# Patient Record
Sex: Female | Born: 1945 | Race: White | Hispanic: No | Smoking: Former smoker
Health system: Southern US, Community
[De-identification: ages and names within clinical notes are randomized; demographics above are authoritative.]

## PROBLEM LIST (undated history)

## (undated) DIAGNOSIS — M542 Cervicalgia: Secondary | ICD-10-CM

## (undated) DIAGNOSIS — M2550 Pain in unspecified joint: Secondary | ICD-10-CM

## (undated) DIAGNOSIS — L84 Corns and callosities: Secondary | ICD-10-CM

## (undated) DIAGNOSIS — M797 Fibromyalgia: Secondary | ICD-10-CM

## (undated) DIAGNOSIS — E669 Obesity, unspecified: Secondary | ICD-10-CM

## (undated) DIAGNOSIS — IMO0002 Reserved for concepts with insufficient information to code with codable children: Secondary | ICD-10-CM

## (undated) DIAGNOSIS — G629 Polyneuropathy, unspecified: Secondary | ICD-10-CM

## (undated) DIAGNOSIS — M1990 Unspecified osteoarthritis, unspecified site: Secondary | ICD-10-CM

## (undated) DIAGNOSIS — C4490 Unspecified malignant neoplasm of skin, unspecified: Secondary | ICD-10-CM

## (undated) DIAGNOSIS — Z8719 Personal history of other diseases of the digestive system: Secondary | ICD-10-CM

## (undated) DIAGNOSIS — M549 Dorsalgia, unspecified: Secondary | ICD-10-CM

## (undated) DIAGNOSIS — G4730 Sleep apnea, unspecified: Secondary | ICD-10-CM

## (undated) HISTORY — DX: Fibromyalgia: M79.7

## (undated) HISTORY — DX: Polyneuropathy, unspecified: G62.9

## (undated) HISTORY — PX: TOTAL KNEE ARTHROPLASTY: SHX125

## (undated) HISTORY — PX: OTHER SURGICAL HISTORY: SHX169

## (undated) HISTORY — DX: Unspecified osteoarthritis, unspecified site: M19.90

## (undated) HISTORY — PX: ROTATOR CUFF REPAIR: SHX139

## (undated) HISTORY — DX: Obesity, unspecified: E66.9

## (undated) HISTORY — PX: BACK SURGERY: SHX140

## (undated) HISTORY — DX: Dorsalgia, unspecified: M54.9

## (undated) HISTORY — DX: Reserved for concepts with insufficient information to code with codable children: IMO0002

## (undated) HISTORY — PX: APPENDECTOMY: SHX54

## (undated) HISTORY — DX: Pain in unspecified joint: M25.50

## (undated) HISTORY — DX: Cervicalgia: M54.2

---

## 1992-02-24 HISTORY — PX: TOTAL ABDOMINAL HYSTERECTOMY: SHX209

## 1997-10-19 ENCOUNTER — Ambulatory Visit (HOSPITAL_COMMUNITY): Admission: RE | Admit: 1997-10-19 | Discharge: 1997-10-19 | Payer: Self-pay | Admitting: Neurosurgery

## 1998-08-13 ENCOUNTER — Other Ambulatory Visit: Admission: RE | Admit: 1998-08-13 | Discharge: 1998-08-13 | Payer: Self-pay | Admitting: Gastroenterology

## 1998-08-13 ENCOUNTER — Encounter (INDEPENDENT_AMBULATORY_CARE_PROVIDER_SITE_OTHER): Payer: Self-pay

## 1999-01-27 ENCOUNTER — Other Ambulatory Visit: Admission: RE | Admit: 1999-01-27 | Discharge: 1999-01-27 | Payer: Self-pay | Admitting: Obstetrics and Gynecology

## 1999-10-04 ENCOUNTER — Ambulatory Visit (HOSPITAL_COMMUNITY): Admission: RE | Admit: 1999-10-04 | Discharge: 1999-10-04 | Payer: Self-pay | Admitting: Neurosurgery

## 1999-10-04 ENCOUNTER — Encounter: Payer: Self-pay | Admitting: Neurosurgery

## 1999-10-31 ENCOUNTER — Encounter: Payer: Self-pay | Admitting: Neurosurgery

## 1999-11-04 ENCOUNTER — Encounter: Payer: Self-pay | Admitting: Neurosurgery

## 1999-11-04 ENCOUNTER — Inpatient Hospital Stay (HOSPITAL_COMMUNITY): Admission: RE | Admit: 1999-11-04 | Discharge: 1999-11-05 | Payer: Self-pay | Admitting: Neurosurgery

## 1999-11-06 ENCOUNTER — Emergency Department (HOSPITAL_COMMUNITY): Admission: EM | Admit: 1999-11-06 | Discharge: 1999-11-06 | Payer: Self-pay | Admitting: Emergency Medicine

## 1999-11-06 ENCOUNTER — Encounter: Payer: Self-pay | Admitting: Emergency Medicine

## 2000-01-10 ENCOUNTER — Encounter: Payer: Self-pay | Admitting: Neurosurgery

## 2000-01-10 ENCOUNTER — Ambulatory Visit (HOSPITAL_COMMUNITY): Admission: RE | Admit: 2000-01-10 | Discharge: 2000-01-10 | Payer: Self-pay | Admitting: Neurosurgery

## 2000-02-03 ENCOUNTER — Ambulatory Visit (HOSPITAL_COMMUNITY): Admission: RE | Admit: 2000-02-03 | Discharge: 2000-02-03 | Payer: Self-pay | Admitting: Neurosurgery

## 2000-02-03 ENCOUNTER — Encounter: Payer: Self-pay | Admitting: Neurosurgery

## 2000-02-18 ENCOUNTER — Ambulatory Visit (HOSPITAL_COMMUNITY): Admission: RE | Admit: 2000-02-18 | Discharge: 2000-02-18 | Payer: Self-pay | Admitting: Neurosurgery

## 2000-02-18 ENCOUNTER — Encounter: Payer: Self-pay | Admitting: Neurosurgery

## 2000-03-11 ENCOUNTER — Ambulatory Visit (HOSPITAL_COMMUNITY): Admission: RE | Admit: 2000-03-11 | Discharge: 2000-03-11 | Payer: Self-pay | Admitting: Neurosurgery

## 2000-03-11 ENCOUNTER — Encounter: Payer: Self-pay | Admitting: Neurosurgery

## 2001-09-07 ENCOUNTER — Ambulatory Visit (HOSPITAL_COMMUNITY): Admission: RE | Admit: 2001-09-07 | Discharge: 2001-09-07 | Payer: Self-pay | Admitting: Neurosurgery

## 2001-12-21 ENCOUNTER — Other Ambulatory Visit: Admission: RE | Admit: 2001-12-21 | Discharge: 2001-12-21 | Payer: Self-pay | Admitting: *Deleted

## 2002-05-09 ENCOUNTER — Encounter: Admission: RE | Admit: 2002-05-09 | Discharge: 2002-06-19 | Payer: Self-pay | Admitting: Orthopedic Surgery

## 2002-11-10 ENCOUNTER — Encounter
Admission: RE | Admit: 2002-11-10 | Discharge: 2003-02-08 | Payer: Self-pay | Admitting: Physical Medicine & Rehabilitation

## 2003-02-08 ENCOUNTER — Ambulatory Visit (HOSPITAL_COMMUNITY): Admission: RE | Admit: 2003-02-08 | Discharge: 2003-02-08 | Payer: Self-pay | Admitting: Neurosurgery

## 2003-03-20 ENCOUNTER — Encounter
Admission: RE | Admit: 2003-03-20 | Discharge: 2003-06-18 | Payer: Self-pay | Admitting: Physical Medicine & Rehabilitation

## 2003-04-03 ENCOUNTER — Encounter
Admission: RE | Admit: 2003-04-03 | Discharge: 2003-04-24 | Payer: Self-pay | Admitting: Physical Medicine & Rehabilitation

## 2003-05-09 ENCOUNTER — Ambulatory Visit (HOSPITAL_COMMUNITY)
Admission: RE | Admit: 2003-05-09 | Discharge: 2003-05-09 | Payer: Self-pay | Admitting: Physical Medicine & Rehabilitation

## 2004-01-11 ENCOUNTER — Encounter: Admission: RE | Admit: 2004-01-11 | Discharge: 2004-01-11 | Payer: Self-pay | Admitting: Neurosurgery

## 2004-04-12 ENCOUNTER — Ambulatory Visit (HOSPITAL_COMMUNITY): Admission: RE | Admit: 2004-04-12 | Discharge: 2004-04-12 | Payer: Self-pay | Admitting: Neurosurgery

## 2004-07-22 ENCOUNTER — Inpatient Hospital Stay (HOSPITAL_COMMUNITY): Admission: RE | Admit: 2004-07-22 | Discharge: 2004-07-28 | Payer: Self-pay | Admitting: Neurosurgery

## 2005-05-06 ENCOUNTER — Ambulatory Visit: Payer: Self-pay | Admitting: Cardiology

## 2005-06-24 ENCOUNTER — Encounter: Payer: Self-pay | Admitting: Neurosurgery

## 2005-07-02 ENCOUNTER — Ambulatory Visit (HOSPITAL_COMMUNITY): Admission: RE | Admit: 2005-07-02 | Discharge: 2005-07-02 | Payer: Self-pay | Admitting: Neurosurgery

## 2005-08-13 ENCOUNTER — Encounter
Admission: RE | Admit: 2005-08-13 | Discharge: 2005-09-04 | Payer: Self-pay | Admitting: Physical Medicine & Rehabilitation

## 2006-02-12 ENCOUNTER — Inpatient Hospital Stay (HOSPITAL_COMMUNITY): Admission: RE | Admit: 2006-02-12 | Discharge: 2006-02-18 | Payer: Self-pay | Admitting: Specialist

## 2006-02-15 ENCOUNTER — Ambulatory Visit: Payer: Self-pay | Admitting: Physical Medicine & Rehabilitation

## 2006-03-15 ENCOUNTER — Encounter: Admission: RE | Admit: 2006-03-15 | Discharge: 2006-04-12 | Payer: Self-pay | Admitting: Specialist

## 2006-04-13 ENCOUNTER — Encounter: Admission: RE | Admit: 2006-04-13 | Discharge: 2006-04-26 | Payer: Self-pay | Admitting: Specialist

## 2006-12-14 ENCOUNTER — Ambulatory Visit: Payer: Self-pay | Admitting: Cardiology

## 2007-01-11 ENCOUNTER — Ambulatory Visit: Payer: Self-pay | Admitting: Gastroenterology

## 2007-01-11 LAB — CONVERTED CEMR LAB
ALT: 22 units/L (ref 0–35)
AST: 22 units/L (ref 0–37)
Albumin: 3.7 g/dL (ref 3.5–5.2)
Alkaline Phosphatase: 77 units/L (ref 39–117)
BUN: 17 mg/dL (ref 6–23)
Basophils Absolute: 0 10*3/uL (ref 0.0–0.1)
Basophils Relative: 0.2 % (ref 0.0–1.0)
Bilirubin, Direct: 0.1 mg/dL (ref 0.0–0.3)
CO2: 29 meq/L (ref 19–32)
Calcium: 9.2 mg/dL (ref 8.4–10.5)
Chloride: 99 meq/L (ref 96–112)
Creatinine, Ser: 0.8 mg/dL (ref 0.4–1.2)
Eosinophils Absolute: 0.1 10*3/uL (ref 0.0–0.6)
Eosinophils Relative: 0.7 % (ref 0.0–5.0)
Ferritin: 51 ng/mL (ref 10.0–291.0)
Folate: 19.9 ng/mL
GFR calc Af Amer: 94 mL/min
GFR calc non Af Amer: 78 mL/min
Glucose, Bld: 106 mg/dL — ABNORMAL HIGH (ref 70–99)
HCT: 43.5 % (ref 36.0–46.0)
Hemoglobin: 15.5 g/dL — ABNORMAL HIGH (ref 12.0–15.0)
Iron: 106 ug/dL (ref 42–145)
Lymphocytes Relative: 34.6 % (ref 12.0–46.0)
MCHC: 35.5 g/dL (ref 30.0–36.0)
MCV: 87.8 fL (ref 78.0–100.0)
Monocytes Absolute: 0.8 10*3/uL — ABNORMAL HIGH (ref 0.2–0.7)
Monocytes Relative: 8.6 % (ref 3.0–11.0)
Neutro Abs: 4.9 10*3/uL (ref 1.4–7.7)
Neutrophils Relative %: 55.9 % (ref 43.0–77.0)
Platelets: 258 10*3/uL (ref 150–400)
Potassium: 4.1 meq/L (ref 3.5–5.1)
RBC: 4.96 M/uL (ref 3.87–5.11)
RDW: 12.9 % (ref 11.5–14.6)
Saturation Ratios: 26.9 % (ref 20.0–50.0)
Sed Rate: 7 mm/hr (ref 0–25)
Sodium: 140 meq/L (ref 135–145)
TSH: 3.59 microintl units/mL (ref 0.35–5.50)
Tissue Transglutaminase Ab, IgA: 0.3 units (ref <7)
Total Bilirubin: 0.9 mg/dL (ref 0.3–1.2)
Total Protein: 6.8 g/dL (ref 6.0–8.3)
Transferrin: 281.4 mg/dL (ref >=212.0)
Vitamin B-12: 218 pg/mL (ref 211–911)
WBC: 8.9 10*3/uL (ref 4.5–10.5)

## 2007-01-17 ENCOUNTER — Encounter: Payer: Self-pay | Admitting: Gastroenterology

## 2007-01-17 ENCOUNTER — Ambulatory Visit: Payer: Self-pay | Admitting: Gastroenterology

## 2007-01-17 DIAGNOSIS — K5730 Diverticulosis of large intestine without perforation or abscess without bleeding: Secondary | ICD-10-CM | POA: Insufficient documentation

## 2007-01-17 DIAGNOSIS — K210 Gastro-esophageal reflux disease with esophagitis: Secondary | ICD-10-CM

## 2007-01-17 DIAGNOSIS — K449 Diaphragmatic hernia without obstruction or gangrene: Secondary | ICD-10-CM | POA: Insufficient documentation

## 2007-01-27 ENCOUNTER — Ambulatory Visit: Payer: Self-pay | Admitting: Gastroenterology

## 2007-03-12 DIAGNOSIS — M549 Dorsalgia, unspecified: Secondary | ICD-10-CM | POA: Insufficient documentation

## 2007-03-12 DIAGNOSIS — M129 Arthropathy, unspecified: Secondary | ICD-10-CM | POA: Insufficient documentation

## 2007-03-12 DIAGNOSIS — K589 Irritable bowel syndrome without diarrhea: Secondary | ICD-10-CM | POA: Insufficient documentation

## 2007-03-12 DIAGNOSIS — R109 Unspecified abdominal pain: Secondary | ICD-10-CM | POA: Insufficient documentation

## 2007-03-12 DIAGNOSIS — E669 Obesity, unspecified: Secondary | ICD-10-CM

## 2007-03-12 DIAGNOSIS — K5900 Constipation, unspecified: Secondary | ICD-10-CM | POA: Insufficient documentation

## 2007-03-12 DIAGNOSIS — D126 Benign neoplasm of colon, unspecified: Secondary | ICD-10-CM | POA: Insufficient documentation

## 2007-03-12 DIAGNOSIS — K6389 Other specified diseases of intestine: Secondary | ICD-10-CM | POA: Insufficient documentation

## 2007-03-12 DIAGNOSIS — E7800 Pure hypercholesterolemia, unspecified: Secondary | ICD-10-CM

## 2007-03-31 ENCOUNTER — Ambulatory Visit: Payer: Self-pay | Admitting: Surgery

## 2007-08-17 ENCOUNTER — Encounter: Admission: RE | Admit: 2007-08-17 | Discharge: 2007-08-17 | Payer: Self-pay | Admitting: Neurosurgery

## 2007-10-12 ENCOUNTER — Encounter: Admission: RE | Admit: 2007-10-12 | Discharge: 2007-11-21 | Payer: Self-pay | Admitting: Specialist

## 2008-06-25 ENCOUNTER — Encounter: Admission: RE | Admit: 2008-06-25 | Discharge: 2008-06-25 | Payer: Self-pay | Admitting: Specialist

## 2008-12-31 ENCOUNTER — Encounter: Admission: RE | Admit: 2008-12-31 | Discharge: 2009-02-21 | Payer: Self-pay | Admitting: Orthopaedic Surgery

## 2009-07-16 ENCOUNTER — Ambulatory Visit (HOSPITAL_COMMUNITY): Admission: RE | Admit: 2009-07-16 | Discharge: 2009-07-16 | Payer: Self-pay | Admitting: Surgery

## 2009-07-31 ENCOUNTER — Ambulatory Visit (HOSPITAL_BASED_OUTPATIENT_CLINIC_OR_DEPARTMENT_OTHER): Admission: RE | Admit: 2009-07-31 | Discharge: 2009-07-31 | Payer: Self-pay | Admitting: Surgery

## 2009-08-03 ENCOUNTER — Ambulatory Visit: Payer: Self-pay | Admitting: Internal Medicine

## 2009-08-20 ENCOUNTER — Ambulatory Visit (HOSPITAL_COMMUNITY): Admission: RE | Admit: 2009-08-20 | Discharge: 2009-08-20 | Payer: Self-pay | Admitting: Surgery

## 2009-08-27 ENCOUNTER — Encounter: Admission: RE | Admit: 2009-08-27 | Discharge: 2009-10-31 | Payer: Self-pay | Admitting: Surgery

## 2009-12-03 ENCOUNTER — Inpatient Hospital Stay (HOSPITAL_COMMUNITY): Admission: RE | Admit: 2009-12-03 | Discharge: 2009-12-04 | Payer: Self-pay | Admitting: Surgery

## 2009-12-03 HISTORY — PX: LAPAROSCOPIC GASTRIC BANDING: SHX1100

## 2009-12-17 ENCOUNTER — Encounter
Admission: RE | Admit: 2009-12-17 | Discharge: 2010-03-17 | Payer: Self-pay | Source: Home / Self Care | Attending: Surgery | Admitting: Surgery

## 2010-02-03 ENCOUNTER — Encounter
Admission: RE | Admit: 2010-02-03 | Discharge: 2010-03-25 | Payer: Self-pay | Source: Home / Self Care | Attending: Surgery | Admitting: Surgery

## 2010-04-07 ENCOUNTER — Encounter: Payer: Medicare Other | Attending: Surgery | Admitting: *Deleted

## 2010-04-07 DIAGNOSIS — Z09 Encounter for follow-up examination after completed treatment for conditions other than malignant neoplasm: Secondary | ICD-10-CM | POA: Insufficient documentation

## 2010-04-07 DIAGNOSIS — Z9884 Bariatric surgery status: Secondary | ICD-10-CM | POA: Insufficient documentation

## 2010-04-07 DIAGNOSIS — Z713 Dietary counseling and surveillance: Secondary | ICD-10-CM | POA: Insufficient documentation

## 2010-05-08 LAB — COMPREHENSIVE METABOLIC PANEL
ALT: 39 U/L — ABNORMAL HIGH (ref 0–35)
AST: 31 U/L (ref 0–37)
Albumin: 4 g/dL (ref 3.5–5.2)
Alkaline Phosphatase: 71 U/L (ref 39–117)
BUN: 15 mg/dL (ref 6–23)
CO2: 29 mEq/L (ref 19–32)
Calcium: 9.2 mg/dL (ref 8.4–10.5)
Chloride: 105 mEq/L (ref 96–112)
Creatinine, Ser: 0.57 mg/dL (ref 0.4–1.2)
GFR calc Af Amer: >60 mL/min (ref >60)
GFR calc non Af Amer: >60 mL/min (ref >60)
Glucose, Bld: 103 mg/dL — ABNORMAL HIGH (ref 70–99)
Potassium: 4.5 mEq/L (ref 3.5–5.1)
Sodium: 141 mEq/L (ref 135–145)
Total Bilirubin: 0.9 mg/dL (ref 0.3–1.2)
Total Protein: 6.9 g/dL (ref 6.0–8.3)

## 2010-05-08 LAB — CBC
HCT: 45 % (ref 36.0–46.0)
Hemoglobin: 15.7 g/dL — ABNORMAL HIGH (ref 12.0–15.0)
MCH: 31.4 pg (ref 26.0–34.0)
MCHC: 34.8 g/dL (ref 30.0–36.0)
MCHC: 35 g/dL (ref 30.0–36.0)
MCV: 90.2 fL (ref 78.0–100.0)
Platelets: 209 10*3/uL (ref 150–400)
Platelets: 229 10*3/uL (ref 150–400)
RBC: 4.99 MIL/uL (ref 3.87–5.11)
RDW: 12.9 % (ref 11.5–15.5)
RDW: 13 % (ref 11.5–15.5)
WBC: 5.4 10*3/uL (ref 4.0–10.5)
WBC: 7.9 10*3/uL (ref 4.0–10.5)

## 2010-05-08 LAB — DIFFERENTIAL
Basophils Absolute: 0 10*3/uL (ref 0.0–0.1)
Basophils Absolute: 0 10*3/uL (ref 0.0–0.1)
Basophils Relative: 0 % (ref 0–1)
Basophils Relative: 0 % (ref 0–1)
Lymphocytes Relative: 22 % (ref 12–46)
Monocytes Relative: 8 % (ref 3–12)
Neutro Abs: 2.3 10*3/uL (ref 1.7–7.7)
Neutro Abs: 5.3 10*3/uL (ref 1.7–7.7)
Neutrophils Relative %: 43 % (ref 43–77)

## 2010-05-08 LAB — SURGICAL PCR SCREEN
MRSA, PCR: NEGATIVE
Staphylococcus aureus: POSITIVE — AB

## 2010-06-09 ENCOUNTER — Ambulatory Visit: Payer: Medicare Other | Admitting: *Deleted

## 2010-06-10 ENCOUNTER — Encounter: Payer: Medicare Other | Attending: Surgery | Admitting: *Deleted

## 2010-06-10 DIAGNOSIS — Z09 Encounter for follow-up examination after completed treatment for conditions other than malignant neoplasm: Secondary | ICD-10-CM | POA: Insufficient documentation

## 2010-06-10 DIAGNOSIS — Z713 Dietary counseling and surveillance: Secondary | ICD-10-CM | POA: Insufficient documentation

## 2010-06-10 DIAGNOSIS — Z9884 Bariatric surgery status: Secondary | ICD-10-CM | POA: Insufficient documentation

## 2010-07-08 NOTE — Assessment & Plan Note (Signed)
[facility]                         GASTROENTEROLOGY OFFICE NOTE   NAME:Miranda Keith, Miranda Keith                        MRN:          [id number]  DATE:01/11/2007                            DOB:          Jul 16, 1945    Miranda Keith is a 65 year old [place] female who has had several months of  constant discomfort in her left lower quadrant with associated gas,  bloating, and rather severe constipation.  She has been seen previously  in our gastroenterology clinic with a history of multiple recurrent  colon polyps and a family history of colon carcinoma.  Her last  colonoscopy exam was in August 2005.  At that time she had rather severe  melanosis coli noted.  She has responded well in the past to treatment  for constipation-predominant irritable bowel syndrome with a variety of  different medications including prokinetic therapy.  She currently is  not on any medications except for AcipHex which she takes for rather  constant recurrent acid reflux.  She denies dysphagia.   The patient follows a regular diet and denies any specific food  intolerances.  She denies fever, chills, or other systemic complaints.   PAST MEDICAL HISTORY:  Remarkable for degenerative arthritis and  hypercholesterolemia.  She has had a previous appendectomy.   MEDICATIONS:  1. Folic acid daily.  2. Allegra 180 mg a day.  3. P.r.n. hydrocodone.   She denies drug allergies.   FAMILY HISTORY:  Remarkable for two aunts and grandmother with colon  cancer.  She also relates that her father had part of his colon removed  but the diagnosis is unclear.   SOCIAL HISTORY:  She is widowed and lives by herself.  She has a 12th-  grade education.  She is retired from [employer] because of chronic back  pain.  She does not smoke or abuse ethanol.   REVIEW OF SYSTEMS:  Otherwise noncontributory.  She relates she has had  recent multiple lab tests per Dr. Sherryll Burger which have not shown any evidence  of thyroid  dysfunction or other endocrine abnormalities.  We have done  previous ultrasounds on her 10 years ago which showed an echo-dense  fatty liver but no evidence of cholelithiasis.  Liver function tests in  the past have been normal but she has had hypercholesterolemia.   She is a healthy-appearing [place] female, appearing slightly older than  her stated age.  She is in no acute distress.  She is 5 feet 5 inches  tall and weighs 264 pounds.  Blood pressure 126/76 and pulse was 86 and  regular.  I could not appreciate stigmata of chronic liver disease or  thyromegaly.  Her chest was clear.  She appeared to be in a regular  rhythm without murmurs, gallops or rubs.  Her abdomen showed exogenous  obesity but no organomegaly, masses or significant tenderness.  Bowel  sounds were normal.  Peripheral extremities were unremarkable.  Inspection of the rectum was unremarkable as was rectal exam.  There was  no evidence of a rectal mass or impaction.  Stool was present and was  guaiac negative.  ASSESSMENT:  1. Constipation-predominant irritable bowel syndrome with current      worsening of unexplained etiology.  2. Strong family history of colon carcinoma.  3. Personal history of recurrent colon polyps.  4. Chronic acid reflux without previous endoscopy.  5. Exogenous obesity - rule out fatty liver and nonalcoholic      steatohepatitis syndrome.  6. History of hypercholesterolemia.  7. Rule out endocrine dysfunction.   RECOMMENDATIONS:  1. High-fiber diet and liberal p.o. fluids.  2. Trial of MiraLax 30 mL at bedtime with Amitiza 8 mcg twice a day.  3. Reflux regime with AcipHex 20 mg 30 minutes before the first meal      of the day.  4. Followup endoscopy and colonoscopy exam.  5. Screening laboratory parameters.  6. Consider acute upper abdominal ultrasound exam.  7. Continue other medications per primary care physician.     Vania Rea. Jarold Motto, MD, Caleen Essex, FAGA  Electronically  Signed    DRP/MedQ  DD: 01/11/2007  DT: 01/12/2007  Job #: [id number]   cc:   Erasmo Leventhal, M.D.  Dr. Sherryll Burger

## 2010-07-11 NOTE — Assessment & Plan Note (Signed)
Medical record number is [id number].   Miranda Keith is back regarding her right leg pain. Still continues to be over the  right anterolateral knee region down into the lateral leg into the top of  the foot. She has occasional back pain. The pain is worse in her legs when  she sits for prolonged periods of time or stands. It also seems to correlate  to the amount of swelling that she has. We performed a triple phase bone  scan on March 16, and this revealed no signs of complex regional pain  syndrome. There were changes of osteoarthritis involving the right knee  noted. The patient has a scheduled Synvisc injection, scheduled for this  Friday with Dr. Thomasena Edis. She remains on 20 mg b.i.d. of furosemide for edema  control. She has a stress test and a cardiac echocardiogram scheduled by her  primary team. The patient still rates her pain as a 8/10.   REVIEW OF SYSTEMS:  On review of systems, the patient reports no recent  chest pain, shortness of breath, wheezing, or coughing. She has numbness in  the right leg. Swelling is noted above. Occasional blurred vision is  mentioned today. She denies any depression, problems with sleep, hearing, or  headaches. She has no nausea, vomiting, reflux, or constipation. She has  experienced some weight gain.   PHYSICAL EXAMINATION:  On physical exam, blood pressure 153/85, pulse 93.  She is saturating 96% on room air. The patient had 5/5 strength both lower  extremities. Edema was 1+ to 2+ in both legs of pitting variety from the  ankles up through the knees. Knee was palpation with palpation, especially  medially. Sensation was possibly diminished in the anterolateral knee and  lower leg. Straight leg testing was equivocal to negative on both sides.  Patrick's test was negative. Reflexes were 2+ at both knees and ankles. The  patient walks with a slight antalgic gait towards the right side.   ASSESSMENT:  Ongoing right lower extremity pain. Triple phase bone  scanning  was normal. The patient has knee injections scheduled with Dr. Thomasena Edis this  week. She has a herniated disk at L4-L5 with some facet hypertrophy and  lateral stenosis, more so on the left than the right. This could possibly  account for some of the pain symptoms in her leg. I do not believe that the  leg pain is secondary to her knee itself or a neuropathy surrounding her  knee surgeries.   PLAN:  1. We will set the patient up for ___________ L4-L5 epidural steroid     injection as both a diagnostic and therapeutic effort.  2. She will continue with her Darvocet for breakthrough pain, N 100, q.4-6h.     p.r.n.  3. I sent her to [facility] today for fitting of custom compression stockings     in the 30 to 40 mL range.  4. I will see her back after completion of her epidural steroid injection.      Ranelle Oyster, M.D.   ZTS/MedQ  D:  06/06/2003 10:52:57  T:  06/06/2003 12:51:09  Job #:  [id number]

## 2010-07-11 NOTE — H&P (Signed)
Miranda Keith, Miranda Keith                 ACCOUNT NO.:  [id number]   MEDICAL RECORD NO.:  [id number]          PATIENT TYPE:  INP   LOCATION:  2899                         FACILITY:  [facility]   PHYSICIAN:  Payton Doughty, M.D.      DATE OF BIRTH:  [birth year]   DATE OF ADMISSION:  07/22/2004  DATE OF DISCHARGE:                                HISTORY & PHYSICAL   ADMITTING DIAGNOSIS:  Lumbar spondylosis and spinal epidural lipomatosis.   BODY OF TEXT:  [name] is now a 65 year old, right-handed [place] lady I have  been seeing since 1994.  She has had pain in her back for numerous years.  Epidural steroids provided about a week of relief.  She has degenerative  spondylosis and spondylolisthesis at L4-5, degenerative disc disease at L5-  S1.  She also has epidural lipomatosis at L2-3 and L3-4.  She has  significant pain in her back as well as down her right leg.  Epidural  steroids have provided minimal relief.  She presents now for evacuation of  epidermal lipomatosis at L2-3 and L3-4 and spinal fusion at L4-5 and L5-S1.   MEDICAL HISTORY:  1. Silicone breast implants.  2. Capsulotomies in 1991 and 1993.  3. Hypothyroidism.  4. She has had anterior cervical at C5-6 and C6-7 in 2001.     MEDICATIONS:  1. Vitamins.  2. ALEVE.  3. Allegra.  4. Estradiol.  5. Flaxseed oil.     SURGICAL HISTORY:  Breast implants as noted before.   SOCIAL HISTORY:  She does not smoke and does not drink.  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.   REVIEW OF SYSTEMS:  Noncontributory for bowel or bladder dysfunction.   PHYSICAL EXAMINATION:  HEENT:  Within normal limits.  NECK:  She has good range of motion in her neck and a well-healed incision.  CHEST:  Clear.  CARDIAC:  Regular rate and rhythm.  ABDOMEN:  Nontender.  No hepatosplenomegaly.  EXTREMITIES:  Without clubbing or cyanosis.  GU:  Deferred.  PULSES:  Peripheral pulses are good.  NEUROLOGIC:  She is awake, alert, and oriented.   Cranial nerves are intact.  Motor exam shows 5/5 strength throughout the upper and lower extremities  until she gets up and walks for a while.  Then she has significant pain in  her back and especially down her right leg.  Reflexes are 2 at the left  knee, 1 at the right, 1 at the ankles bilaterally.  Straight leg raise is  positive on the right.   MRI results have been reviewed above.   CLINICAL IMPRESSION:  1. Lumbar spondylosis with intractable back and right lower extremity      radiculopathy.  2. Epidural lipomatosis.     PLAN:  As outlined above, evacuation of lipomatosis at L2-3 and L3-4, and  lumbar fusion at L4-5 and L5-S1.  The risks and benefits of this approach  have been discussed with her, and she wished to proceed.      MWR/MEDQ  D:  07/22/2004  T:  07/22/2004  Job:  [id number] 

## 2010-07-11 NOTE — H&P (Signed)
[place]  Patient:    Miranda Keith, Miranda Keith                        MRN: [id number] Adm. Date:  [id number] Attending:  Emeterio Reeve                         History and Physical  ADMITTING DIAGNOSIS:  Cervical spondylosis at C5-6 and C6-7.  HISTORY OF PRESENT ILLNESS:  This is a 65 year old right-handed [place] lady whom I had been seeing since 1994 with cervical spondylosis.  She has had progression of neck, upper extremity discomfort, and has spondylosis at C5-6 and C6-7 and is admitted for an anterior cervical diskectomy and fusion.  PAST MEDICAL HISTORY:  Remarkable for having had silicone breast implants and capsulotomies for those in 1991 and 1993.  She also had hypothyroidism, for which she was on Armour thyroid.  She no longer is.  MEDICATIONS:  Vitamins, Aleve as needed, Allegra one a day, estradiol one a day, and flax seed oil.  PAST SURGICAL HISTORY:  Remarkable for the breast implants as noted before.  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.  REVIEW OF SYSTEMS:  Noncontributory for bowel and bladder dysfunction.  PHYSICAL EXAMINATION:  HEENT:  Within normal limits.  NECK:  She has reasonable range of motion in her neck with discomfort in all phases.  CHEST:  Clear.  CARDIAC:  Regular rate and rhythm.  ABDOMEN:  Nontender with no hepatosplenomegaly.  EXTREMITIES:  Without clubbing or cyanosis.  Peripheral pulses are good.  GENITOURINARY:  Deferred.  NEUROLOGIC:  She is awake, alert and oriented.  Cranial nerves are intact. The motor exam shows 5/5 strength throughout the upper and lower extremities. There is no current sensory deficit.  Reflexes are absent at the biceps or at the triceps, absent at the brachioradialis, 1 in the lower extremities at the knees, 1 at the ankles.  Toes downgoing bilaterally, and Hoffmans is negative.  DIAGNOSTIC EVALUATION:   Her MRI shows spondylitic disease, much worse at 5-6 and 6-7, with cord compression.  At 4-5, there is some foraminal narrowing.  PLAN:  Anterior cervical diskectomy and fusion at C5-6 and C6-7.  The risks and benefits of this approach have been discussed with her, and she wishes to proceed. DD:  11/04/99 TD:  11/05/99 Job: [id number]

## 2010-07-11 NOTE — Assessment & Plan Note (Signed)
HISTORY:  Miranda Keith is back regarding her right lower extremity pain.  Apparently her knee and lumbar MRIs were negative although I do not have  reports to confirm this.  She still had some right leg pain.  She has come  off of her Neurontin as she could not tolerate it.  She did not use the  Lidoderm patches regularly.  She had taken Celexa for a brief period of time  and felt that this really helped her discomfort.  She had restarted the  Topamax for a brief period of time and tolerated it from a side effect  standpoint but then did not feel that it was really helping her at this  point.   The pain remains in the right knee from essentially the kneecap down to the  foot.  Pain is most prominent along the anterolateral portion of the leg and  the lateral foot and underfoot.  She denies any cold sensations or frank  color changes in the leg.  No allodynia is noted.  She does report ongoing  numbness associated with the pain.  She denies back pain today.  Pain is  worse when she is standing for prolonged periods.  It seems to be painful at  night or day.  It is a little bit better when she extends the leg rather  than bends the leg.   REVIEW OF SYSTEMS:  Patient denies any chest pain.  She had some shortness  of breath with the Mobic and thus stopped.  Denies any recent coughing or  fevers.  She had some swelling in the legs.  No reflux symptoms, nausea,  vomiting, or diarrhea are noted.  Reports pain from fibromyalgia.   PHYSICAL EXAMINATION:  Today patient is pleasant in no acute distress.  Blood pressure 128/75, pulse 85, she is saturating 95% on room air.  Patient  has minimal pain with manipulation of the knee today and minimal  peripatellar swelling.  There is continued sensory findings of anterolateral  leg.  Reflexes are 1+ bilaterally and motor function is normal.  There is no  color or temperature changes in either leg.  No allodynia is noted.  Low  back exam is stable.   ASSESSMENT:  1. Ongoing right leg pain which appears to be neuropathic.  I am beginning     to doubt that this is a saphenous nerve inflammation.  Pain is more in     the distribution of the sciatic or peroneal nerve.  2. Osteoarthritis of the right knee.   PLAN:  1. I would like to set the patient up for nerve conduction studies of the     right lower extremity to rule out peroneal or sciatic nerve injury.  We     also will try to look at the saphenous nerve.  2. We will restart Topamax as the patient was able to tolerate it but we     need to get it to a higher dose.  We will work up to 100 mg once daily.  3. We can restart her Celexa as this seemed to help pain as well.  We can     add 20 mg at bedtime.  We will observe her response.  4. May consider other treatments such as desensitization therapies Anodyne     as appropriate.  5. We will see the patient back in approximately 2-4 weeks' time pending the     results of her nerve conduction study.  Ranelle Oyster, M.D.   ZTS/MedQ  D:  03/21/2003 11:19:45  T:  03/21/2003 13:03:42  Job #:  [id number]   cc:   Erasmo Leventhal, M.D.  [facility]  [address]  [address]  [place]  [place] [address]  Fax: [phone]

## 2010-07-11 NOTE — Assessment & Plan Note (Signed)
Miranda Keith is back regarding her right leg pain.  We sent her for outpatient  therapy at the last visit with some suspicion of complex regional pain  syndrome in the right lower extremity.  She has felt that therapy has been  beneficial while she is receiving it, particularly massage and some  desensitization exercises.  However, when she leaves therapy she feels that  the pain returns.  She is having a lot of pain in the right lower leg and  into the top of the right foot along the first and second toes.  She was  unable to tolerate the Topamax further and this was stopped.  The Topamax  did not seem to be helping her pain.  She has had problems with ongoing  edema in both lower extremities.  She is supposed to follow up with her  family doctor this week.  She remains on diuretics to help control this, as  well as off the shelf compression stockings.  The pain in her legs  definitely is associated with the amount of swelling she has in her feet and  legs.  Her right foot pain seems to be much worse when she walks for  prolonged periods of time.  She also notes pain when she sits or bends.  On  further questioning today, she stated that the pain really came from her low  back into her leg and foot today.  The nerve conduction studies that we  performed in late January revealed relayed latencies and decreased  amplitude, right more so than left, although both sides were slightly  abnormal.  I felt that most of the abnormalities were secondary to her  edema, however.  Her EMG studies were completely normal in the left lower  extremity.   The patient rates her pain as an 8/10 today.  It improves with rest and ice  and is worse with walking and bending.   I was able to review her lumbar spine scan from December, which revealed a  small left paracentral HNP at L1 and L2, grade 1 closed arch and __________  at L4 and L5, and degenerative facet changes, particularly at L4-5.  There  is a foraminal  disk herniation on the left at L4-5 with foraminal stenosis  on the right at L4-5.  There is foraminal stenosis bilaterally at L5-S1 plus  a soft disk herniation posterolateral and lateral on the left at L5-S1.   REVIEW OF SYSTEMS:  The patient notes some chest discomfort and shortness of  breath.  Swelling is noted.  She has had no wheezing or coughing.  There is  numbness in the right leg and hands as well.  (She was diagnosed with carpal  tunnel syndrome in the past.)  She has had some occasional headaches with  anxiety.  Sleep has been fair.  She denies nausea, vomiting, reflux,  diarrhea, or constipation recently.   PHYSICAL EXAMINATION:  On physical examination today, the blood pressure is  133/82, pulse 102, respiratory rate 16, and she is saturating 94% on room  air.  The patient walks with a slight limp to the right side.  She is alert  and appropriate.  Both lower extremities revealed 2+ nonpitting edema.  Strength was intact in both legs at 5/5 and reflexes were 1+ on either  extremity.  There were some slight color changes in the right foot, but  these are not significant.  The skin was warm and she had good capillary  refill.  Pulses were intact.  Straight leg testing was possibly positive on  the right today at the extreme degrees of flexions at 70 degrees plus.  Luisa Hart tests were negative on either side.  The patient had no frank  allodynia in the leg today, although she was a bit hypersensitive to more  deep stimulation today.   ASSESSMENT:  1. Ongoing right leg pain.  There is still a suspicion of complex regional     pain syndrome, type 1.  2. 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  Cannot rule out     right lumbar radiculopathy considering the multiple abnormalities on her     scan.   PLAN:  1. I would like the patient to continue with her outpatient therapies as she     has had some relief with this.  I also would like to transition her to a      lumbar back treatment plan with McKenzie exercises included.  2. We could consider a lumbar epidural injection based on her progress and     presentation next time.  3. I would like to send the patient for triple phase bone scan to rule out     complex regional pain syndrome in the right lower extremity.  4. The patient needs to work on her lower extremity edema and we discussed     some ways to do so.  She will follow up with her primary physician     regarding her diuretics.  She may need some custom TED hose.  5. I hesitate to start anything new for neuropathic pain at this point as     she will probably have some swelling side     effects with anything we try.  6. I will see her back in approximately one month's time.      Ranelle Oyster, M.D.   ZTS/MedQ  D:  05/07/2003 11:28:41  T:  05/07/2003 12:29:40  Job #:  [id number]   cc:   Erasmo Leventhal, M.D.  [facility]  [address]  [address]  [place]  [place] [address]  Fax: [phone]

## 2010-07-11 NOTE — Procedures (Signed)
NAME:  Miranda Keith, Miranda Keith                           ACCOUNT NO.:  [id number]   MEDICAL RECORD NO.:  [id number]                   PATIENT TYPE:  REC   LOCATION:  TPC                                  FACILITY:  [facility]   PHYSICIAN:  Erick Colace, M.D.           DATE OF BIRTH:  1945/06/22   DATE OF PROCEDURE:  DATE OF DISCHARGE:                                 OPERATIVE REPORT   Referred for L4-5 epidural sternoid injection.  History of right anterior  knee pain into the lateral leg and the top of the foot.   The patient denies any use of blood thinners, including specifically  Coumadin, warfarin, Aggrenox, Plavix, and aspirin.  She denies any allergy  to lidocaine, Omnipaque/contrast dye, or Kenalog.   Informed consent was obtained after explaining the risks and benefits of the  procedure to the patient.  These included bleeding, bruising, infection,  loss of bowel and bladder function, and increased pain.  She gives written  consent and elects to proceed.  The patient was placed prone on the  fluoroscopy table.  Sterile Betadine prep and sterile drape.  Fluoroscopic  guidance utilized for the procedure.  Lidocaine 1% 2 mL were used to  anesthetize the skin and subcutaneous tissues using a 25 gauge 1-1/4 inch  needle.  Then a 22 gauge spinal needle was utilized and guided under  fluoroscopic guidance into the proper position.  Then a solution of  Omnipaque 180 x 0.5 mL was injected under live fluoroscopy.  Initially the  nerve root only was enhanced, but after advancing the needle not to exceed  the 6 o'clock position medially, then subpedicular flow of the contrast was  seen after an additional 0.5 mL was injected under live fluoroscopy.  Then a  solution containing 1 mL of 40 mg/mL of Kenalog and 2 mL of methylparaben-  free lidocaine were injected.  The patient tolerated the procedure well.  Post injection instructions given.  The patient is to follow up in three  weeks.  If the  above was helpful, consider reinjection if it seems to be  wearing off.  If it was not helpful at all, will do a nonselective L4-5.                                                Erick Colace, M.D.    AEK/MEDQ  D:  06/07/2003 14:10:19  T:  06/07/2003 16:08:07  Job:  [id number]

## 2010-07-11 NOTE — H&P (Signed)
[place]. [facility]  Patient:    Miranda Keith, Miranda Keith                        MRN: [id number] Adm. Date:  [id number] Attending:  Emeterio Reeve                         History and Physical  ADMISSION DIAGNOSES:  Cervical spondylosis at C5-6 and C6-7.  HISTORY:  This is a 65 year old, right-handed [place] lady whom I have been seeing since 1994, with cervical spondylosis.  She has had progression of neck and upper extremity discomfort, and has spondylosis at C5-6 and C6-7, and is admitted for an anterior cervical diskectomy and fusion.  PAST MEDICAL HISTORY:  Remarkable for having had silicone breast implants, and capsulotomies for those in 1991, and 1993.  She also had hypothyroidism, for which she was on Armour thyroid.  She no longer is.  CURRENT MEDICATIONS: 1. Vitamins. 2. Aleve p.r.n. 3. Allegra one q.d. 4. Estradiol one q.d. 5. Flax seed oil.  PAST SURGICAL HISTORY:  Breast implants as noted before.  SOCIAL HISTORY:  She does not smoke and does not drink.  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.  REVIEW OF SYSTEMS:  Noncontributory for bowel or bladder dysfunction.  PHYSICAL EXAMINATION:  HEENT:  Within normal limits.  NECK:  She has a reasonable range of motion with discomfort in all phases.  CHEST:  Clear.  CARDIAC:  A regular rate and rhythm.  ABDOMEN:  Nontender with no hepatosplenomegaly.  EXTREMITIES:  Without clubbing or cyanosis.  Peripheral pulses good.  GENITOURINARY:  Deferred.  NEUROLOGIC:  She is awake, alert, oriented.  Her cranial nerves are intact. Motor examination shows 5/5 strength throughout the upper and lower extremities.  There is no current sensory deficit.  Reflexes are absent at the biceps, 1 at the triceps, absent at the brachial radialis, 1 in the lower extremities at the knees, 1 at the ankles.  Toes are downgoing bilaterally, and Hoffmans negative.  Her MRI shows spondylitic disease, much  worse at C5-6 and C6-7, with cord compression.  At C4-5 there is some foraminal narrowing.  PLAN:  An anterior cervical diskectomy and fusion at C5-6 and C6-7.  The risks and benefits of this approach have been discussed with her, and she wishes to proceed. DD:  11/04/99 TD:  11/04/99 Job: [id number]

## 2010-07-11 NOTE — H&P (Signed)
NAME:  Miranda Keith, Miranda Keith                 ACCOUNT NO.:  [id number]   MEDICAL RECORD NO.:  [id number]           PATIENT TYPE:   LOCATION:                                 FACILITY:   PHYSICIAN:  Erasmo Leventhal, M.D.DATE OF BIRTH:  October 04, 1945   DATE OF ADMISSION:  02/12/2006  DATE OF DISCHARGE:                              HISTORY & PHYSICAL   ADMITTING DIAGNOSIS:  End-stage osteoarthritis right knee.   BRIEF HISTORY:  This is a 65 year old lady with a history of end-stage  osteoarthritis of her right knee that has failed conservative measures.  Due to continued pain with daily activity, she is now scheduled for a  total knee arthroplasty of her right knee.  The surgery, risks,  benefits, and aftercare were discussed in detail with the patient;  questions invited and answered; and surgery to go ahead as scheduled.  Note, that on her visit to the office on Monday; she had a laceration on  the volar aspect of her small toe on the left where she hit it into a  doorway.  There were no signs of infection; and she was placed on  prophylactic Keflex and saline-soaks and we will check his right prior  to surgery; and, as well as, this looks good we will proceed with  surgery.   ALLERGIES:  Drug allergies none.   CURRENT MEDICATIONS:  1. Elavil 10 mg q.h.s.  2. __________ 20 mg p.o. p.r.n. swelling.  3. Aciphex 1 p.o. daily   PAST SURGERIES INCLUDE:  1. Lumbar fusion.  2. Cervical fusion.  3. Hysterectomy.  4. Tonsillectomy.  5. Appendectomy.  6. Knee arthroscopy.   SERIOUS MEDICAL ILLNESSES INCLUDE:  Fibromyalgia and reflux.   FAMILY HISTORY:  Positive for rheumatoid arthritis, coronary disease,  Alzheimer's, and cancer.   SOCIAL HISTORY:  The patient is single.  She lives alone at home.  She  does not smoke and does not drink.   REVIEW OF SYSTEMS:  CENTRAL NERVOUS SYSTEM:  Negative for headache,  blurred vision, or dizziness.  PULMONARY:  No shortness of breath, PND  or  orthopnea.  CARDIOVASCULAR:  Negative for chest pain or palpitation.  GI: Positive for GERD.  Negative for ulcers or hepatitis.  GU: Negative  for urinary tract difficulty.  MUSCULOSKELETAL:  Positive as in HPI.   PHYSICAL EXAM:  VITAL SIGNS:  BP 120/90, respirations 18, pulse 80 and  regular.  GENERAL APPEARANCE:  This is a well-developed, well-nourished, obese  lady in no acute distress.  HEENT:  Head normocephalic.  Nose patent.  Ears patent.  Pupils equal,  round, and react to light.  Throat without injection.  NECK:  Supple without adenopathy.  Carotids 2+ without bruit.  CHEST:  Clear to auscultation.  No rales or rhonchi.  Respirations 18.  HEART:  Regular rate and rhythm at 80 beats per without murmur.  ABDOMEN:  Soft with active bowel sounds.  No masses or organomegaly.  NEUROLOGIC:  The patient alert and oriented to time, place, and person.  Cranial nerves II-XII grossly intact.  EXTREMITIES:  The right knee shows 0-130  degree range of motion.  Dorsalis pedis and posterior tibialis pulses are 2+.  Sensation and  circulation are intact.  Her left small toe has a laceration on the  volar aspect at the MP joint.  This is already in early stages of  healing.  There is no overt infection.  X-ray shows no fracture.  We  will start her on prophylactic Keflex, and salt water soaks, and check  it just prior to surgery.  X-rays show end-stage osteoarthritis of the  right knee.   IMPRESSION:  End-stage osteoarthritis right knee.   PLAN:  Total knee arthroplasty right knee.      Jaquelyn Bitter. Chabon, P.A.    ______________________________  Erasmo Leventhal, M.D.    SJC/MEDQ  D:  02/08/2006  T:  02/08/2006  Job:  [id number]

## 2010-07-11 NOTE — Assessment & Plan Note (Signed)
REASON FOR EVALUATION:  I am seeing Aleena again about her right lower  extremity pain which we felt was probably neuropathic related to saphenous  nerve inflammation.  Dylin had difficulty tolerating the Topamax, as it was  making her dizzy and forgetful.  She nearly fell in the bathroom.  She has  felt that the medicine really was not of any benefit to her either.  She has  been seen in the interim by Dr. Payton Doughty again, who ordered lumbar spine  MRIs apparently.  Dr. Erasmo Leventhal has ordered a followup MRI of  the knee as well.  Pain seems to be worse in the right leg with mobility.  It does bother her to a degree when she sits, but it is more painful when  she is up and about.  She has had some swelling in both legs, not  preferentially in right leg.  She noticed no frank color changes.  She does  feel that the leg is cooler at times.  She notes that the pain and numbness  are in a distribution now more in the lateral leg down to the knee and into  the top of the foot.  She denies any back pain.  Her back will tighten up a  little bit with prolonged standing.   MEDICATIONS:  She remains on:  1. Mobic 7.5 mg daily.  2. Hydrocodone 10 mg for breakthrough pain.   REVIEW OF SYSTEMS:  The patient denies any nausea, vomiting or diarrhea.  Pertinent positives are listed above.  No chest pain or shortness of breath  are noted.   PHYSICAL EXAMINATION:  GENERAL:  On physical examination today, the patient  is pleasant, in no acute distress.  VITAL SIGNS:  Blood pressure is 134/74, pulse is 88, she is saturating 95%  on room air.  NEUROMUSCULAR:  On examination of the right knee, she had a little pain with  manipulation or valgus or varus stresses today.  She appeared to be stable  in the AP plane.  There was peripatellar swelling noted around both knees.  She had decreased pinprick throughout the anterolateral thigh and leg.  She  had better sensation on the back of the foot,  calf and medial posterior  thigh.  Above the waist, she had no problems.  Left leg seemed to have  normal sensation.  Motor strength was 5/5 throughout.  Reflexes were 1+  bilaterally.  I noticed no asymmetries in size between the legs.  There are  no color changes nor temperature changes between either side.  The low back  was examined and there was minimal-to-no-pain with palpation in the  lumbosacral spine, associated paraspinals or facets.  PSIS areas were  nontender.  Greater trochanter regions were minimally tender and piriformis  muscles were nontender.  She had normal range of motion with flexion,  extension, lateral rotation and lateral bending.  Straight leg maneuvers  were negative bilaterally.  Patrick's test was negative.   ASSESSMENT:  1. Ongoing right leg pain which is possibly neuropathic in relation to a     saphenous nerve inflammation.  2. Osteoarthritis of the right knee.  3. Rule out lumbar radiculopathy.  4. Consider reflex sympathetic dystrophy.   PLAN:  1. I agree with lumbar spine MRI.  However, none of her exam except for her     pain distribution and numbness fits with radiculopathy today.  2. I will await the results of her right knee MRI as well.  3. Secondary to her intolerance of the Topamax, we will begin her on a trial     of Neurontin beginning at 300 mg nightly for one week and then titrating     up by 300 mg for two weeks until we reach 300 mg t.i.d.  She is to call     me with any side-effects.  4. I gave her samples of Lidoderm patches to place along the knee and foot     as she needs.  5. If she continues to have pain, despite normal diagnostic workup, could     consider a triple phase bone scan of the     right lower extremity.  6. I will see the patient back in approximately four weeks' time.      Ranelle Oyster, M.D.   ZTS/MedQ  D:  02/02/2003 16:27:50  T:  02/03/2003 04:53:49  Job #:  [id number]   cc:   Erasmo Leventhal, M.D.   [address]  [place]  [place] [address]  Fax: [phone]   Payton Doughty, M.D.  [address]  [place]  [place] [address]  Fax: [phone]

## 2010-07-11 NOTE — Op Note (Signed)
[place]. [facility]  Patient:    Miranda Keith, Miranda Keith                        MRN: [id number] Proc. Date: 11/04/99 Adm. Date:  [id number] Attending:  Emeterio Reeve                           Operative Report  PREOPERATIVE DIAGNOSIS:  Spondylosis, disks C5-6 and C6-7.  POSTOPERATIVE DIAGNOSIS:  Spondylosis, disks C5-6 and C6-7.  OPERATION: Anterior cervical diskectomy and fusion at C5-6 and C6-7. SURGEON:   Payton Doughty, M.D.  SERVICE:  Neurosurgery.  ANESTHESIA:  General endotracheal anesthesia.  ASSISTANT:  Stefani Dama, M.D.  PREP:    In sterile manner and scrubbed with alcohol wipe.  COMPLICATIONS:  None.  INDICATIONS:  This is a 65 year old right-handed [place] lady with severe cervical spondylosis.  DESCRIPTION OF PROCEDURE:  She was taken to the operating suite, intubated, and placed prone on the operating table.  Following shave, prep, and drape in the usual sterile fashion, skin was incised paralleling sternocleidomastoid muscle, starting at the level of the broad tubercle and extending inferiorly. The platysma was identified, elevated, divided, and undermined.   The sternocleidomastoid was identified and medial dissection revealed the carotid artery retracted laterally to the left.  The trachea and the esophagus were retracted laterally to the right.  The right omohyoid muscle was divided. Intraoperative x-rays showed a marker at 4-5.  Diskectomy was carried out at the two levels below that.  Longus coli was taken down, and the shadow line retractor placed.  Diskectomy was then carried out at 5-6 and 6-7, first under gross observation and then using the operating microscope.  On both levels, there was severe spondylitic disease and impressive osteopenia, and the bone was quite soft.  Resection of the osteophytes and decompressive foraminotomy was accomplished without difficulty.   Then 7 mm bone grafts were fashioned from patellar autograft  and tapped into place.  The 41 mm tether plate was then placed with two 13 mm screws in C7, one in C6, and two in C5. Intraoperative x-rays showed good placement of the bone grafts, plate, and screws.  The wound was irrigated and hemostasis assured.  The platysma was reapproximated with 3-0 Vicryl in interrupted fashion, subcutaneous tissue was reapproximated with 3-0 Vicryl in interrupted fashion.  The skin was closed with 4-0 Vicryl in running subcuticular fashion.  Benzoin and Steri-Strips were placed including Telfa at the op site.  The patient was placed in an Aspen collar and returned to the recovery room in good condition.DD:  11/04/99 TD:  11/05/99 Job: [id number]

## 2010-07-11 NOTE — Discharge Summary (Signed)
NAMEROBYNN, Miranda                 ACCOUNT NO.:  [id number]   MEDICAL RECORD NO.:  [id number]          PATIENT TYPE:  INP   LOCATION:  3013                         FACILITY:  [facility]   PHYSICIAN:  Payton Doughty, M.D.      DATE OF BIRTH:  August 06, 1945   DATE OF ADMISSION:  07/22/2004  DATE OF DISCHARGE:  07/28/2004                                 DISCHARGE SUMMARY   ADMISSION DIAGNOSIS:  1.  Spondylosis L4-5, L5-S1.  2.  Epidural lipomatosis L2 through S1.   PROCEDURE:  1.  L2-3, L3-4 laminotomy and foraminotomy for epidural lipomatosis.  2.  L4-5, L5-S1 laminectomy and diskectomy.  3.  Posterior lumbar interbody fusion __________ fusion cages and      posterolateral arthrodesis.   ANESTHESIA:  Endotracheal __________.   COMPLICATIONS:  None.  Discharge in satisfactory condition, alive and well.   HISTORY OF PRESENT ILLNESS:  This is a 65 year old right-handed Miranda Keith.  History and physical is routine on the chart.  She has had pain in her back  with extensive degenerative change.  Epidural steroids have not provided  relief and she is admitted for decompression, fusion and evacuation of  epidural lipomatosis.   PAST MEDICAL HISTORY:  Breasts implants.  Capsulotomies.  Hypothyroidism.  Anterior and cervical decompression and fusion.   PHYSICAL EXAMINATION:  GENERAL:  Intact.  NEUROLOGIC:  Showed intact strength with intractable back and leg pain with  activity.   HOSPITAL COURSE:  She was admitted after ascertaining normal laboratory  values and underwent a lumbar fusion as noted above.  Postoperatively she  has done well.  She participated in physical therapy.  The Foley was removed  the second postoperative day.  She is eating and voiding normally.  She did  have one UTI and was treated with ciprofloxacin.  Ciprofloxacin was left on  prophylactically for several days.  Currently she is awake, alert and  oriented.  She does have some incisional pain.  She is able to get up  an  about.   DISCHARGE INSTRUCTIONS:  She is being discharged home in the care of her  family.  Percocet for pain.  Flexeril for spasm.  Her followup will be in  the [facility] office in about one week for suture  removal.     MWR/MEDQ  D:  07/28/2004  T:  07/28/2004  Job:  [id number]

## 2010-07-11 NOTE — Discharge Summary (Signed)
NAME:  Miranda Keith, Miranda Keith                 ACCOUNT NO.:  [id number]   MEDICAL RECORD NO.:  [id number]          PATIENT TYPE:  INP   LOCATION:  1612                         FACILITY:  [facility]   PHYSICIAN:  Erasmo Leventhal, M.D.DATE OF BIRTH:  1945/09/05   DATE OF ADMISSION:  02/12/2006  DATE OF DISCHARGE:  02/18/2006                               DISCHARGE SUMMARY   ADMITTING DIAGNOSIS:  End-stage osteoarthritis right knee.   DISCHARGE DIAGNOSIS:  Same.   OPERATION:  Total knee arthroplasty right knee.   BRIEF HISTORY:  This is a 65 year old lady with a history of end-stage  osteoarthritis of the right knee that has failed conservative measures  to alleviate her pain. After discussion of treatment, benefits, risks  and options now scheduled for total knee arthroplasty of her right knee.   LABORATORY VALUES:  Admission CBC showed the hemoglobin high at 16.2,  hematocrit high at 47.1, RBC high at 5.27. Hemoglobin/hematocrit reached  a low of 9.8 and 27.9 on the 24th. The admission PT/PTT within normal  limits. Admission CMET showed the glucose high at 100; otherwise, within  normal limits. She ran mildly elevated glucose to a high of 126 during  admission. Urinalysis showed trace of leukocyte esterase; otherwise,  negative.   COURSE IN THE HOSPITAL:  The patient tolerated the operative procedure  well. She did have some mild nausea the first day.  Her vital signs were  stable. She was afebrile. Hemoglobin/hematocrit were stable. Hemovac was  removed without difficulty. She was started on physical therapy and CPM.  Second postoperative day she stated she was having some moderate pain  and that she could not move her leg. Her vital signs were stable.  Temperature to a max of 100.1, now 98.3, O2 91 on 2 liters. I&O was  good.  Hemoglobin/hematocrit were stable. BMET within normal limits with  exception of glucose 114, calcium 8.1. Lungs were clear. Heart sounds  normal. Bowel sounds  sluggish. Calves were negative. She had mild pain  in her medial thigh. Negative cords. No bruising. Neurovascular status  was intact in the legs. She could contract her quadriceps and hamstrings  on command and actually could move her legs well with some prompting.  Her dressing was changed. Wound was benign. Her PCA was DC'd. Her IV was  switched to hep well, and she was placed on incentive spirometry q. 30  minutes for mild temperature. There was question about whether the  patient would make good progress at this point, and a rehab consult was  obtained. Third postoperative day she was complaining of some mild  nausea and mild headache. Her vital signs were stable. Incision was  benign. Calves were negative. She was continued on her Lovenox and daily  physical therapy. Rehab did not feel that the patient was a candidate  for rehab, and she did start to turn the corner and do better in her  physical therapy and after discussion of treatment options and skilled  nursing facility, the patient decided that she could make it at home  with home health and home physical therapy.  On February 17, 2006 she was  feeling better. Vital signs were stable. She was continuing to make slow  but steady progress in physical therapy. Her wound was benign. Calves  were negative. Her toe wound was present prior to surgery. Was healed  without difficulty, and she continued in physical therapy. On February 18, 2006 the patient was feeling better. Vital signs were stable. Her  temperatures to a max of 99, O2 94 on room air. I&O was good. Lungs  clear. Heart sounds normal. Bowel sounds active. Calves were negative.  Wound was benign. The patient had made progress in therapy, and she was  subsequently discharged home with home health, home PT for follow-up in  the office.   CONDITION ON DISCHARGE:  Improved.   DISCHARGE MEDICATIONS:  1. Lovenox 30 mg one shot q.12 h. for five more days for a total of 10       days postop.  2. Robaxin 500, one p.o. __________ p.r.n. spasm.  3. Percocet one q.6 h. p.r.n. pain.  4. Trinsicon one twice a day for anemia.   FOLLOWUP:  Follow up in the office in two weeks.   DISCHARGE INSTRUCTIONS:  Do her CPM eight hours a day. Work hard on  physical therapy with her home therapist and call if problems or  questions arise.      Jaquelyn Bitter. Chabon, P.A.    ______________________________  Erasmo Leventhal, M.D.    SJC/MEDQ  D:  03/04/2006  T:  03/04/2006  Job:  [id number]

## 2010-07-11 NOTE — Op Note (Signed)
NAMEJOZELYN, KUWAHARA                 ACCOUNT NO.:  [id number]   MEDICAL RECORD NO.:  [id number]          PATIENT TYPE:  INP   LOCATION:  2899                         FACILITY:  [facility]   PHYSICIAN:  Payton Doughty, M.D.      DATE OF BIRTH:  Oct 10, 1945   DATE OF PROCEDURE:  07/22/2004  DATE OF DISCHARGE:                                 OPERATIVE REPORT   PREOPERATIVE DIAGNOSES:  1. 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  2. Lumbar spondylosis, L4-5 and L5-S1.     POSTOPERATIVE DIAGNOSES:  1. 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  2. Lumbar spondylosis, L4-5 and L5-S1.     OPERATION/PROCEDURE:  L4-5, L5-S1 laminectomy, diskectomy and posterior  lumbar interbody fusion with Ray Threaded Fusion Cages and posterolateral  arthrodesis with Infuse BMP in L2-3, L3-4, laminotomy, foraminotomy and  evacuation of lipomatosis.   SURGEON:  Payton Doughty, M.D.   ANESTHESIA:  General endotracheal anesthesia.   PREP:  Alcohol wipe.   COMPLICATIONS:  None.   NURSE ASSISTANT:  [place].   PHYSICIAN ASSISTANT:  Cristi Loron, M.D.  __________ techs.   INDICATIONS:  This 65 year old lady with severe lumbar spondylosis and  epidural lipomatosis.   DESCRIPTION OF PROCEDURE:  She was taken to the operating room, smoothly  anesthetized, intubated, placed prone on the operating room table.  Following shave, prep and drape in the usual sterile fashion, skin was  infiltrated with 1% lidocaine with 1:400,000 epinephrine.   The skin was incised from the mid L2 to mid  S1 and the lamina of L2, L3,  L4, L5 and S1 and the facet joints at L4-5 and L5-S1 as well as the  transverse processes of the L4, L5 and S1 were exposed bilaterally in the  subperiosteal plane.  Intraoperative x-ray confirmed correctness of level.  Bilateral laminotomies and foraminotomies were done L2-3 and L3-4 to allow  evacuation of epidural lipomatosis.  This was accomplished with marked  reduction in  the mass effect of the spinal canal.  At L4 and L5, the pars  intra-articularis lamina and inferior facet were removed bilaterally and the  superior facet of L5-S1 removed bilaterally using the high-speed drill.  The  bone set aside for grafting.  This allowed decompression of the L4, L5, and  S1 nerve roots as they traversed this area.  At L4-5, there was  spondylolisthesis of grade 1 which reduced with removal of the facet joints.  At L5-S1, there was severe spondylitic changes and entrapment of the 5 root,  particularly on the right side, and then lateral recessed.  Following  complete decompression of the nerve roots,  Ray Threaded Fusion Cages were  placed, 12 x 21 mm, at L5-S1, 16 x 21 mm at L4-5.  The cages were packed  with bone graft harvested from the facet joints.  Intraoperative x-rays  showed good placement of the cages.  The transverse processes of L4, L5 and  the sacral ala were decorticated and BMP on the extender matrix was placed  across it.  The wound was irrigated prior to this.  Hemostasis was assured.  The fascia was reapproximated with 0 Vicryl interrupted fashion.  Subcutaneous tissue was reapproximated with 0 Vicryl in interrupted fashion.  Skin was closed with 3-0 nylon in a running locked fashion.  Betadine and  Telfa dressing were applied and made occlusive with Op-Site.  The patient  returned to the recovery room in good condition.      MWR/MEDQ  D:  07/22/2004  T:  07/22/2004  Job:  [id number]

## 2010-07-11 NOTE — Op Note (Signed)
NAME:  Miranda Keith, Miranda Keith                 ACCOUNT NO.:  [id number]   MEDICAL RECORD NO.:  [id number]          PATIENT TYPE:  INP   LOCATION:  1503                         FACILITY:  [facility]   PHYSICIAN:  Erasmo Leventhal, M.D.DATE OF BIRTH:  11-Jun-1945   DATE OF PROCEDURE:  02/12/2006  DATE OF DISCHARGE:                               OPERATIVE REPORT   PREOPERATIVE DIAGNOSIS:  Right knee end-stage osteoarthritis.   POSTOPERATIVE DIAGNOSIS:  Right knee end-stage osteoarthritis.   PROCEDURE:  Right total knee arthroplasty.   SURGEON:  Erasmo Leventhal, M.D.   ASSISTANT:  Jaquelyn Bitter. Chabon, PA-C.   ANESTHESIA:  Spinal with monitored anesthesia care.   ESTIMATED BLOOD LOSS:  Less than 100 cc.   DRAINS:  Two medium Hemovac.   COMPLICATIONS:  None.   TOURNIQUET TIME:  One hour, 20 minutes at 350 mmHg.   COMPLICATIONS:  None.   DISPOSITION:  To PACU stable.   OPERATIVE DETAILS:  Patient was counseled in the holding area.  IV was  started.  Taken to the operating room where a spinal anesthetic was  administered.  Antibiotics were given prophylactically.  The Foley  catheter was placed utilizing sterile technique by the OR circulating  nurse.  All extremities were well padded and bumped.  The right lower  extremity was elevated.  It was prepped and draped in a sterile fashion  and exsanguinated.  The tourniquet was inflated to 350 mmHg.  A straight  and midline incision was made through the skin and subcutaneous tissue.  She has morbid obesity, large skin flaps were developed at the  appropriate level.  Hemostasis obtained.  Medial parapatellar arthrotomy  was performed.  A proximal medial soft tissue release was done.  The  knee was flexed.  The patella was retracted out of the way.  End-stage  arthritis changes, bone against bone.  Cruciate ligaments were resected.  A starter hole made at the distal femur.  The canal was irrigated.  The  effluent was clear.  An  intramedullary rod was gently placed.  I chose a  5 degree valgus cut and a 12 mm cut off the distal femur due to her  flexion contracture of 8 degrees.  The distal femur was found to be a  size 5.  Rotation marks were set.  It was cut to fit a size #5.  Medial  and lateral menisci were removed under direct visualization.  Geniculate  vessels were coagulated.  Posterior neurovascular structures were  __________ off and protected throughout the entire case.  The tibial  eminence was resected.  The proximal tibia was found to be a size 4.  A  starter hole was made.  A step reamer, canal irrigated.  Intramedullary  rod was gently placed.  I chose a 0 degree slope with a 10 mm cut, based  upon the least deficient side, which was the lateral side.  Posterior  medial and posterior femoral osteophytes were removed under direct  visualization.  At this time, with flexion and extension blocks with a  10 mm insert, we had excellent flexion  and extension gaps.  The tibia  was subluxed anteriorly.  A base plate was applied.  Rotation coverage  was set for #4.  Reamer and punch was utilized.  A femoral box cut was  now prepared.  At this time, a size 5 femur and size 4 tibia, 10 insert,  excellent range of motion with soft tissue balance.  The patella was  found to be a size 35.  The appropriate amount of bone was resected.  Locking holes were made, and a patellar button was applied.  We now had  good patellofemoral tracking, well balanced flexion and extension.  The  trials were all removed and irrigated with good pulsatile lavage.  Utilizing a modern cement technique, all components were cemented in  place, size 4 tibia, size 5 femur, 10 insert, and a 35 patella.  After  cement had cured, we did a 10 and a 12.5 trial insert.  With the 12.5  trial insert, we had full extension.  We started varus and valgus stress  at 0-90 degrees of flexion, well balanced, with extra patellofemoral  tracking.  The  trial was then removed.  Excess cement was removed.  The  knee was irrigated again.  A final 12.5 mm posterior stabilized rotating  platform tibial insert was implanted.  We now had a well-balanced, well-  aligned knee.  Irrigated again.  Two medium Hemovac drains were placed.  A sequential closure of layers was done.  Arthrotomy, Vicryl, subcu  Vicryl.  The skin closed with a subcuticular Monocryl suture.  The drain  was hooked to suction.  Sterile dressing was applied to the knee.  The  tourniquet was deflated.  Normal circulation of the foot and the ankle  at the end of the case.  The patient tolerated the procedure well with  no complications or problems.  She was gently awakened.  She was taken  to the operating room and PACU in stable condition.   To help with surgical time and technique, Mr. Leilani Able, PA-C's,  assistance, was needed throughout the entire case.           ______________________________  Erasmo Leventhal, M.D.     RAC/MEDQ  D:  02/12/2006  T:  02/12/2006  Job:  [id number]

## 2010-07-24 ENCOUNTER — Ambulatory Visit: Payer: Medicare Other | Admitting: *Deleted

## 2010-09-11 ENCOUNTER — Institutional Professional Consult (permissible substitution): Payer: Medicare Other | Admitting: Internal Medicine

## 2010-10-03 ENCOUNTER — Ambulatory Visit (INDEPENDENT_AMBULATORY_CARE_PROVIDER_SITE_OTHER): Payer: Medicare Other | Admitting: Physician Assistant

## 2010-10-03 ENCOUNTER — Encounter (INDEPENDENT_AMBULATORY_CARE_PROVIDER_SITE_OTHER): Payer: Self-pay

## 2010-10-03 ENCOUNTER — Other Ambulatory Visit (INDEPENDENT_AMBULATORY_CARE_PROVIDER_SITE_OTHER): Payer: Self-pay | Admitting: General Surgery

## 2010-10-03 DIAGNOSIS — G4730 Sleep apnea, unspecified: Secondary | ICD-10-CM

## 2010-10-03 NOTE — Progress Notes (Signed)
  HISTORY: Miranda Keith is a 65 y.o.female who received an AP-Large lap-band in October 2011 by Dr. Daphine Deutscher. She has no new complaints. Her portion sizes remain small and her hunger is under good control. She denies persistent regurgitation.  VITAL SIGNS: Filed Vitals:   10/03/10 1051  BP: 138/86    PHYSICAL EXAM: Physical exam reveals a very well-appearing 65 y.o.female in no apparent distress Neurologic: Awake, alert, oriented Psych: Bright affect, conversant Respiratory: Breathing even and unlabored. No stridor or wheezing Extremities: Atraumatic, good range of motion. Skin: Warm, Dry, no rashes Musculoskeletal: Normal gait, Joints normal  ASSESMENT: 65 y.o.  female  s/p AP-Large lap-band.   PLAN: No adjustment today as she seems to be in the green zone. We'll have her back in 2 months for her one year anniversary appointment or sooner if necessary.

## 2010-10-03 NOTE — Patient Instructions (Signed)
Return in 2 months or sooner if you have increased hunger or increased portion sizes at meals.

## 2010-10-06 ENCOUNTER — Other Ambulatory Visit (INDEPENDENT_AMBULATORY_CARE_PROVIDER_SITE_OTHER): Payer: Self-pay | Admitting: General Surgery

## 2010-10-06 DIAGNOSIS — G4733 Obstructive sleep apnea (adult) (pediatric): Secondary | ICD-10-CM

## 2010-10-07 ENCOUNTER — Other Ambulatory Visit: Payer: Self-pay | Admitting: Neurosurgery

## 2010-10-07 DIAGNOSIS — M47816 Spondylosis without myelopathy or radiculopathy, lumbar region: Secondary | ICD-10-CM

## 2010-10-15 ENCOUNTER — Other Ambulatory Visit: Payer: Medicare Other

## 2010-10-21 ENCOUNTER — Encounter: Payer: Self-pay | Admitting: Pulmonary Disease

## 2010-10-21 ENCOUNTER — Ambulatory Visit (INDEPENDENT_AMBULATORY_CARE_PROVIDER_SITE_OTHER): Payer: Medicare Other | Admitting: Pulmonary Disease

## 2010-10-21 VITALS — BP 120/80 | HR 95 | Temp 98.1°F | Ht 63.5 in | Wt 260.4 lb

## 2010-10-21 DIAGNOSIS — G4733 Obstructive sleep apnea (adult) (pediatric): Secondary | ICD-10-CM

## 2010-10-21 NOTE — Patient Instructions (Signed)
Will set up on cpap, and please call if having issues with tolerance Work on weight loss followup with me in 5 weeks.  

## 2010-10-21 NOTE — Assessment & Plan Note (Signed)
Patient has history of moderate sleep apnea dating back to 2011.  She has not lost weight since her bariatric surgery, and continues to have nonrestorative sleep as well as inappropriate daytime sleepiness.  I think that she would benefit from more aggressive treatment of her sleep apnea, unless she thinks she can lose weight more consistently in the coming months.  The patient agrees that CPAP is the best choice currently, and we'll go ahead and get her started on this.  I have had a long discussion with the pt about sleep apnea, including its impact on QOL and CV health.

## 2010-10-21 NOTE — Progress Notes (Signed)
  Subjective:    Patient ID: Miranda Keith, female    DOB: Apr 11, 1945, 65 y.o.   MRN: [id number]  HPI The patient is a 65 year old female who I've been asked to see for management of obstructive sleep apnea.  She underwent PSG last year, and was found to have an AHI of 21 events per hour.  She was titrated to a final CPAP pressure of 9 cm of water.  The patient underwent bariatric surgery, and the hope was that she would lose significant weight.  Unfortunately, she is only 6 pounds less then her weight at the time of her sleep study in 2011.  The patient states that she is unsure if she snores, but denies any choking arousals.  She does admit that she is not rested after arising in the a.m.'s., and does note sleep pressure during the day with reading.  She also will fall asleep in the evening if she sits in a recliner to watch television.  She has only occasional sleepiness while driving.  Sleep Questionnaire: What time do you typically go to bed?( Between what hours) 9 to 10 pm How long does it take you to fall asleep? long time How many times during the night do you wake up? 4 What time do you get out of bed to start your day? 0600 Do you drive or operate heavy machinery in your occupation? No How much has your weight changed (up or down) over the past two years? (In pounds) 23 lb (10.433 kg) Have you ever had a sleep study before? Yes If yes, location of study? [facility] If yes, date of study? 2011 Do you currently use CPAP? No Do you wear oxygen at any time? No    Review of Systems  Constitutional: Negative for fever and unexpected weight change.  HENT: Positive for congestion and sneezing. Negative for ear pain, nosebleeds, sore throat, rhinorrhea, trouble swallowing, dental problem, postnasal drip and sinus pressure.   Eyes: Negative for redness and itching.  Respiratory: Positive for shortness of breath. Negative for cough, chest tightness and wheezing.   Cardiovascular: Negative for palpitations and  leg swelling.  Gastrointestinal: Negative for nausea and vomiting.  Genitourinary: Negative for dysuria.  Musculoskeletal: Positive for joint swelling.  Skin: Negative for rash.  Neurological: Positive for headaches.  Hematological: Does not bruise/bleed easily.  Psychiatric/Behavioral: Negative for dysphoric mood. The patient is not nervous/anxious.        Objective:   Physical Exam Constitutional:  Obese female, no acute distress  HENT:  Nares patent without discharge  Oropharynx without exudate, palate and uvula are elongated.    Eyes:  Perrla, eomi, no scleral icterus  Neck:  No JVD, no TMG  Cardiovascular:  Normal rate, regular rhythm, no rubs or gallops.  No murmurs        Intact distal pulses  Pulmonary :  Normal breath sounds, no stridor or respiratory distress   No rales, rhonchi, or wheezing  Abdominal:  Soft, nondistended, bowel sounds present.  No tenderness noted.   Musculoskeletal:  2+ lower extremity edema noted.  Lymph Nodes:  No cervical lymphadenopathy noted  Skin:  No cyanosis noted  Neurologic:  Alert, appropriate, moves all 4 extremities without obvious deficit.         Assessment & Plan:

## 2010-10-22 ENCOUNTER — Ambulatory Visit
Admission: RE | Admit: 2010-10-22 | Discharge: 2010-10-22 | Disposition: A | Discharge status: Home / Self Care | Payer: Medicare Other | Source: Ambulatory Visit | Attending: Neurosurgery | Admitting: Neurosurgery

## 2010-10-22 DIAGNOSIS — M47816 Spondylosis without myelopathy or radiculopathy, lumbar region: Secondary | ICD-10-CM

## 2010-10-23 ENCOUNTER — Encounter (INDEPENDENT_AMBULATORY_CARE_PROVIDER_SITE_OTHER): Payer: Self-pay | Admitting: General Surgery

## 2010-10-23 NOTE — Progress Notes (Signed)
Received a request from [employer] regarding office notes for OV prior to sleep study on 07/31/09. Notes faxed to [employer] for DOS 06/28/10 attn Valda Lamb. Copies enclosed in Bariatric Paper chart. Pt is being fitted for a CPAP

## 2010-10-30 ENCOUNTER — Encounter: Payer: Self-pay | Admitting: Internal Medicine

## 2010-11-04 ENCOUNTER — Other Ambulatory Visit: Payer: Self-pay | Admitting: Neurosurgery

## 2010-11-04 DIAGNOSIS — M47816 Spondylosis without myelopathy or radiculopathy, lumbar region: Secondary | ICD-10-CM

## 2010-11-07 ENCOUNTER — Ambulatory Visit
Admission: RE | Admit: 2010-11-07 | Discharge: 2010-11-07 | Disposition: A | Discharge status: Home / Self Care | Payer: Medicare Other | Source: Ambulatory Visit | Attending: Neurosurgery | Admitting: Neurosurgery

## 2010-11-07 DIAGNOSIS — M47816 Spondylosis without myelopathy or radiculopathy, lumbar region: Secondary | ICD-10-CM

## 2010-11-07 MED ORDER — METHYLPREDNISOLONE ACETATE 40 MG/ML INJ SUSP (RADIOLOG
120.0000 mg | Freq: Once | INTRAMUSCULAR | Status: AC
  Administered 2010-11-07: 120 mg via EPIDURAL

## 2010-11-07 MED ORDER — IOHEXOL 180 MG/ML  SOLN
1.0000 mL | Freq: Once | INTRAMUSCULAR | Status: AC | PRN
  Administered 2010-11-07: 1 mL via EPIDURAL

## 2010-11-12 ENCOUNTER — Encounter: Payer: Self-pay | Admitting: *Deleted

## 2010-11-12 ENCOUNTER — Telehealth: Payer: Self-pay | Admitting: *Deleted

## 2010-11-12 DIAGNOSIS — G4733 Obstructive sleep apnea (adult) (pediatric): Secondary | ICD-10-CM

## 2010-11-12 NOTE — Telephone Encounter (Signed)
The above patient is having issues with her Cpap.  Pressure set to 10cm with hh.  Masks that she  has tried medium Quattro ffm medium Quattro fx and now going out to try liberty ffm. Patient is experiencing headaches and swelling of they eyes.  There are no leaks from mask and mask is not too tight.  Would welcome any suggestions

## 2010-11-12 NOTE — Telephone Encounter (Signed)
Will forward message to [facility] to address.

## 2010-11-13 NOTE — Telephone Encounter (Signed)
If she is not finding a mask that fits, see if she would consider going to sleep center during day for mask fitting there.  They do a good job of fitting.

## 2010-11-13 NOTE — Telephone Encounter (Signed)
PT RETURNED CALL . [name]  °

## 2010-11-13 NOTE — Telephone Encounter (Signed)
lmomtcb for pt 

## 2010-11-13 NOTE — Telephone Encounter (Signed)
Called and spoke with pt.  Pt is appreciative of KC's suggestion and is willing to go to the sleep lab for a mask fit.  Will send order to [facility].

## 2010-11-19 ENCOUNTER — Other Ambulatory Visit (HOSPITAL_BASED_OUTPATIENT_CLINIC_OR_DEPARTMENT_OTHER): Payer: Medicare Other

## 2010-11-25 ENCOUNTER — Ambulatory Visit: Payer: Medicare Other | Admitting: Pulmonary Disease

## 2010-12-02 ENCOUNTER — Ambulatory Visit: Payer: Medicare Other | Admitting: Pulmonary Disease

## 2010-12-12 ENCOUNTER — Encounter (INDEPENDENT_AMBULATORY_CARE_PROVIDER_SITE_OTHER): Payer: Medicare Other

## 2010-12-19 ENCOUNTER — Encounter (INDEPENDENT_AMBULATORY_CARE_PROVIDER_SITE_OTHER): Payer: Medicare Other

## 2010-12-30 ENCOUNTER — Ambulatory Visit: Payer: Medicare Other | Admitting: Pulmonary Disease

## 2011-01-01 ENCOUNTER — Encounter (INDEPENDENT_AMBULATORY_CARE_PROVIDER_SITE_OTHER): Payer: Self-pay | Admitting: Surgery

## 2011-01-02 ENCOUNTER — Ambulatory Visit (INDEPENDENT_AMBULATORY_CARE_PROVIDER_SITE_OTHER): Payer: Medicare Other | Admitting: Physician Assistant

## 2011-01-02 ENCOUNTER — Encounter (INDEPENDENT_AMBULATORY_CARE_PROVIDER_SITE_OTHER): Payer: Self-pay

## 2011-01-02 VITALS — BP 128/74 | HR 80 | Temp 96.7°F | Resp 16 | Ht 63.5 in | Wt 260.8 lb

## 2011-01-02 DIAGNOSIS — Z4651 Encounter for fitting and adjustment of gastric lap band: Secondary | ICD-10-CM

## 2011-01-02 NOTE — Progress Notes (Signed)
  HISTORY: Miranda Keith is a 65 y.o.female who received an AP-Large lap-band in October 2011 by Dr. Daphine Deutscher. She comes in disappointed with her overall weight loss. She says she's eating more than she thinks she should. No vomiting or regurgitation.  VITAL SIGNS: Filed Vitals:   01/02/11 1337  BP: 128/74  Pulse: 80  Temp: 96.7 F (35.9 C)  Resp: 16    PHYSICAL EXAM: Physical exam reveals a very well-appearing 65 y.o.female in no apparent distress Neurologic: Awake, alert, oriented Psych: Bright affect, conversant Respiratory: Breathing even and unlabored. No stridor or wheezing Abdomen: Soft, nontender, nondistended to palpation. Incisions well-healed. No incisional hernias. Port easily palpated. Extremities: Atraumatic, good range of motion.  ASSESMENT: 65 y.o.  female  s/p AP-Large lap-band.   PLAN: The patient's port was accessed with a 20G Huber needle without difficulty. Clear fluid was aspirated and 0.75 mL saline was added to the port. The patient was able to swallow water without difficulty following the procedure and was instructed to take clear liquids for the next 24-48 hours and advance slowly as tolerated.

## 2011-01-02 NOTE — Patient Instructions (Signed)
Take clear liquids for the next 48 hours. Thin protein shakes are ok to start on Saturday evening. Call us if you have persistent vomiting or regurgitation, night cough or reflux symptoms. Return as scheduled or sooner if you notice no changes in hunger/portion sizes.   

## 2011-01-21 ENCOUNTER — Encounter: Payer: Self-pay | Admitting: Pulmonary Disease

## 2011-01-21 ENCOUNTER — Ambulatory Visit (INDEPENDENT_AMBULATORY_CARE_PROVIDER_SITE_OTHER): Payer: Medicare Other | Admitting: Pulmonary Disease

## 2011-01-21 VITALS — BP 122/80 | HR 84 | Temp 98.3°F | Ht 63.5 in | Wt 264.6 lb

## 2011-01-21 DIAGNOSIS — G4733 Obstructive sleep apnea (adult) (pediatric): Secondary | ICD-10-CM

## 2011-01-21 NOTE — Patient Instructions (Signed)
Will discontinue cpap Work on weight loss Consider referral to dentistry for a dental appliance and let us know.  Review educational material and call if questions.

## 2011-01-21 NOTE — Progress Notes (Signed)
  Subjective:    Patient ID: Miranda Keith, female    DOB: [birth year], 65 y.o.   MRN: [id number]  HPI The patient comes in today for followup of her obstructive sleep apnea.  She was started on CPAP at the last visit, and has had great difficulty with tolerance.  She was able to get a mask that fit well, and has not had issues with pressure tolerance.  However, she has had issues with headaches and "feeling bad" every time she uses the device.  She has been using the heated humidifier, and is not pulling her mask straps too tightly.  She does not feel this is a viable therapy for her and wishes to discontinue.   Review of Systems  Constitutional: Negative for fever and unexpected weight change.  HENT: Positive for congestion, rhinorrhea, sneezing, postnasal drip and sinus pressure. Negative for ear pain, nosebleeds, sore throat, trouble swallowing and dental problem.   Eyes: Positive for redness and itching.  Respiratory: Negative for cough, chest tightness, shortness of breath and wheezing.   Cardiovascular: Positive for leg swelling. Negative for palpitations.  Gastrointestinal: Negative for nausea and vomiting.  Genitourinary: Negative for dysuria.  Musculoskeletal: Positive for joint swelling.  Skin: Negative for rash.  Neurological: Positive for headaches.  Hematological: Bruises/bleeds easily.  Psychiatric/Behavioral: Negative for dysphoric mood. The patient is not nervous/anxious.        Objective:   Physical Exam Obese female in no acute distress No skin breakdown or pressure necrosis from the CPAP mask Lower extremities with mild edema, no cyanosis Alert and oriented, moves all 4 extremities.       Assessment & Plan:

## 2011-01-21 NOTE — Assessment & Plan Note (Signed)
The patient has been intolerant of CPAP, and feels very strongly this is not a viable treatment option for her.  I have outlined the other options for treatment of moderate sleep apnea, including a trial of weight loss alone, dental appliance, and upper airway surgery.  The patient would like to work on weight loss alone, and we'll consider a dental appliance.  I have given her educational material on this, and she will let me know if she decides to have the referral.

## 2011-04-30 ENCOUNTER — Encounter (INDEPENDENT_AMBULATORY_CARE_PROVIDER_SITE_OTHER): Payer: Medicare Other

## 2011-06-04 ENCOUNTER — Ambulatory Visit (INDEPENDENT_AMBULATORY_CARE_PROVIDER_SITE_OTHER): Payer: Medicare Other | Admitting: Physician Assistant

## 2011-06-04 ENCOUNTER — Encounter (INDEPENDENT_AMBULATORY_CARE_PROVIDER_SITE_OTHER): Payer: Self-pay

## 2011-06-04 VITALS — BP 130/90 | HR 72 | Temp 97.6°F | Resp 16 | Ht 63.5 in | Wt 269.2 lb

## 2011-06-04 DIAGNOSIS — E7800 Pure hypercholesterolemia, unspecified: Secondary | ICD-10-CM

## 2011-06-04 DIAGNOSIS — Z4651 Encounter for fitting and adjustment of gastric lap band: Secondary | ICD-10-CM

## 2011-06-04 DIAGNOSIS — Z9884 Bariatric surgery status: Secondary | ICD-10-CM

## 2011-06-04 NOTE — Progress Notes (Signed)
  HISTORY: Miranda Keith is a 66 y.o.female who received an AP-Large lap-band in October 2011 by Dr. Daphine Deutscher. She comes in today with significant social issues affecting her weight loss success.  She is above her pre-op weight and is openly frustrated. She desires to start anew today, and is eager to consult with nutrition and behavioral health as well as take suggestions regarding her lap band.  VITAL SIGNS: Filed Vitals:   06/04/11 1118  BP: 130/90  Pulse: 72  Temp: 97.6 F (36.4 C)  Resp: 16    PHYSICAL EXAM: Physical exam reveals a very well-appearing 66 y.o.female in no apparent distress Neurologic: Awake, alert, oriented Psych: Bright affect, conversant Respiratory: Breathing even and unlabored. No stridor or wheezing Abdomen: Soft, nontender, nondistended to palpation. Incisions well-healed. No incisional hernias. Port easily palpated. Extremities: Atraumatic, good range of motion.  ASSESMENT: 66 y.o.  female  s/p AP-Large lap-band.   PLAN: The patient's port was accessed with a 20G Huber needle without difficulty. Clear fluid was aspirated and 0.5 mL saline was added to the port. The patient was able to swallow water without difficulty following the procedure and was instructed to take clear liquids for the next 24-48 hours and advance slowly as tolerated.

## 2011-06-04 NOTE — Progress Notes (Signed)
Addended by: Lenise Herald on: 06/04/2011 12:07 PM   Modules accepted: Orders

## 2011-06-04 NOTE — Patient Instructions (Signed)
Take clear liquids tonight. Thin protein shakes are ok to start tomorrow morning. Slowly advance your diet thereafter. Call us if you have persistent vomiting or regurgitation, night cough or reflux symptoms. Return as scheduled or sooner if you notice no changes in hunger/portion sizes.  

## 2011-07-02 ENCOUNTER — Encounter (INDEPENDENT_AMBULATORY_CARE_PROVIDER_SITE_OTHER): Payer: Medicare Other

## 2011-07-23 ENCOUNTER — Encounter (INDEPENDENT_AMBULATORY_CARE_PROVIDER_SITE_OTHER): Payer: Self-pay

## 2011-07-23 ENCOUNTER — Ambulatory Visit (INDEPENDENT_AMBULATORY_CARE_PROVIDER_SITE_OTHER): Payer: Medicare Other | Admitting: Physician Assistant

## 2011-07-23 NOTE — Patient Instructions (Signed)
Return in two months. Follow-up with Huntley Dec in Nutrition.

## 2011-07-23 NOTE — Progress Notes (Signed)
  HISTORY: Miranda Keith is a 66 y.o.female who received an AP-Large lap-band in October 2011 by Dr. Daphine Deutscher. She comes in with no real change in her weight. She says that she really isn't all that hungry and her portion sizes are pretty small. She doesn't believe an adjustment is needed. She had some vomiting early on after her last adjustment but this has resolved. She plans on following up with nutrition and moving some exercise equipment into her house.  VITAL SIGNS: Filed Vitals:   07/23/11 1226  BP: 126/84  Pulse: 80  Temp: 97 F (36.1 C)  Resp: 16    PHYSICAL EXAM: Physical exam reveals a very well-appearing 66 y.o.female in no apparent distress Neurologic: Awake, alert, oriented Psych: Bright affect, conversant Respiratory: Breathing even and unlabored. No stridor or wheezing Extremities: Atraumatic, good range of motion. Skin: Warm, Dry, no rashes Musculoskeletal: Normal gait, Joints normal  ASSESMENT: 66 y.o.  female  s/p AP-Large lap-band.   PLAN: Increase exercise Follow-up with nutrition Return in 2 months

## 2011-08-11 ENCOUNTER — Encounter (HOSPITAL_COMMUNITY): Payer: Self-pay

## 2011-08-20 ENCOUNTER — Ambulatory Visit: Payer: Medicare Other | Admitting: Dietician

## 2011-08-24 ENCOUNTER — Ambulatory Visit: Payer: Medicare Other | Admitting: *Deleted

## 2011-08-25 ENCOUNTER — Ambulatory Visit (HOSPITAL_COMMUNITY): Admission: RE | Admit: 2011-08-25 | Payer: Medicare Other | Source: Ambulatory Visit | Admitting: Orthopaedic Surgery

## 2011-08-25 ENCOUNTER — Encounter (HOSPITAL_COMMUNITY): Admission: RE | Payer: Self-pay | Source: Ambulatory Visit

## 2011-08-25 SURGERY — ARTHROPLASTY, KNEE, TOTAL
Anesthesia: Regional | Laterality: Left

## 2011-08-26 ENCOUNTER — Other Ambulatory Visit: Payer: Self-pay | Admitting: Neurosurgery

## 2011-08-26 ENCOUNTER — Ambulatory Visit: Payer: Medicare Other | Admitting: *Deleted

## 2011-08-26 DIAGNOSIS — R200 Anesthesia of skin: Secondary | ICD-10-CM

## 2011-08-26 DIAGNOSIS — M542 Cervicalgia: Secondary | ICD-10-CM

## 2011-09-03 ENCOUNTER — Ambulatory Visit
Admission: RE | Admit: 2011-09-03 | Discharge: 2011-09-03 | Disposition: A | Discharge status: Home / Self Care | Payer: Medicare Other | Source: Ambulatory Visit | Attending: Neurosurgery | Admitting: Neurosurgery

## 2011-09-03 DIAGNOSIS — R200 Anesthesia of skin: Secondary | ICD-10-CM

## 2011-09-03 DIAGNOSIS — M542 Cervicalgia: Secondary | ICD-10-CM

## 2011-09-17 ENCOUNTER — Encounter: Payer: Self-pay | Admitting: *Deleted

## 2011-09-17 ENCOUNTER — Encounter: Payer: Medicare Other | Attending: Surgery | Admitting: *Deleted

## 2011-09-17 ENCOUNTER — Ambulatory Visit (INDEPENDENT_AMBULATORY_CARE_PROVIDER_SITE_OTHER): Payer: Medicare Other | Admitting: Physician Assistant

## 2011-09-17 ENCOUNTER — Encounter (INDEPENDENT_AMBULATORY_CARE_PROVIDER_SITE_OTHER): Payer: Self-pay | Admitting: Physician Assistant

## 2011-09-17 VITALS — Ht 63.5 in | Wt 267.7 lb

## 2011-09-17 DIAGNOSIS — Z9884 Bariatric surgery status: Secondary | ICD-10-CM | POA: Insufficient documentation

## 2011-09-17 DIAGNOSIS — Z09 Encounter for follow-up examination after completed treatment for conditions other than malignant neoplasm: Secondary | ICD-10-CM | POA: Insufficient documentation

## 2011-09-17 DIAGNOSIS — E669 Obesity, unspecified: Secondary | ICD-10-CM

## 2011-09-17 DIAGNOSIS — Z713 Dietary counseling and surveillance: Secondary | ICD-10-CM | POA: Insufficient documentation

## 2011-09-17 NOTE — Progress Notes (Addendum)
  Follow-up visit:  21 Months Post-Operative LAGB Surgery  Medical Nutrition Therapy:  Appt start time: 1145 end time:  1245.  Primary concerns today: Post-operative Bariatric Surgery Nutrition Management. Naeemah returns for f/u after 8 mo hiatus from [facility].  Has gained 13.9 lbs since last visit in Nov '12. Reports highly increased stress levels related to family. Grandchildren living in her house while she lives with/takes care of her mom. Has been eating 2-3 sandwiches daily and skipping breakfast. Also reports increasing gas that is "getting worse every year".  Has neck surgery scheduled for 10/05/11. Discussed resuming high protein diet to prepare for surgery and jump start her weight loss.  States she only has 3 visits per year covered by insurance.  Surgery date: 12/03/09 Start weight at [facility]: 275.9 lbs (08/27/09) Last weight: 253.8 lbs (01/09/11)  Weight today: 267.7 lbs Weight change: +13.9 lbs Total weight lost: 8.2 lbs BMI: 46.6 kg/m^2  Weight goal: 170 lbs % Weight goal met: 7%  24-hr recall: B (AM): None Snk (AM): None  L (PM): Chicken (3 oz), green beans, onion/squash stir-fry OR Chicken sandwich w/ lite mayo Snk (PM): 1/2 chicken sandwich  D (PM): Chicken sandwich or green beans and salad Snk (PM): None  Fluid intake: 64-80 oz (unsweet tea with stevia) Estimated total protein intake: 50-60g   Medications: See medication list Supplementation: Taking as instructed  Using straws: No Drinking while eating: No Hair loss: No Carbonated beverages: Rare N/V/D/C: Gas - increases every year Last Lap-Band fill:  06/04/11 - pt has appt with Mardelle Matte today, but does not think she needs a fill at this time.   Recent physical activity:  None d/t pain in back, neck, and knee  Progress Towards Goal(s):  In progress.  Handouts given during visit include:  Bariatric Surgery Modified Post-Op Diet   Nutritional Diagnosis:  Goltry-3.3 Overweight/obesity As related to excessive CHO intake  and meal skipping.  As evidenced by patient reported food history and weight gain of 13.9 lbs since last visit.    Intervention:  Nutrition education/reinforcement.  Monitoring/Evaluation:  Dietary intake, exercise, lap band fills, and body weight. Follow up after recovering from neck surgery.

## 2011-09-17 NOTE — Patient Instructions (Signed)
Return in 4 months or sooner if needed.

## 2011-09-17 NOTE — Patient Instructions (Addendum)
Goals:  Follow Phase 3B: High Protein + Non-Starchy Vegetables  Eat 3-6 small meals/snacks, every 3-5 hrs - No meal skipping!! :)  Increase lean protein foods to meet 60-80g goal  Increase fluid intake to 64oz +  Add 15 grams of carbohydrate (fruit, whole grain, starchy vegetable) with meals or snacks  Avoid drinking 15 minutes before, during and 30 minutes after eating  Aim for >30 min of physical activity daily  Try the [employer] Nutrition Label Scale (check ebay for cheaper one OR www.[employer].com for $39.95)

## 2011-09-17 NOTE — Progress Notes (Signed)
  HISTORY: Miranda Keith is a 66 y.o.female who received an AP-Large lap-band in October 2011 by Dr. Daphine Deutscher. She comes in with no complaints of hunger or larger portion sizes. She's lost 2 lbs since her last visit. No regurgitation or reflux symptoms. She's met with nutrition today and is on a new plan to hopefully jump start her weight loss.  VITAL SIGNS: Filed Vitals:   09/17/11 1304  BP: 132/94  Pulse: 88  Temp: 98.6 F (37 C)  Resp: 16    PHYSICAL EXAM: Physical exam reveals a very well-appearing 66 y.o.female in no apparent distress Neurologic: Awake, alert, oriented Psych: Bright affect, conversant Respiratory: Breathing even and unlabored. No stridor or wheezing Extremities: Atraumatic, good range of motion. Skin: Warm, Dry, no rashes Musculoskeletal: Normal gait, Joints normal  ASSESMENT: 66 y.o.  female  s/p AP-Large lap-band.   PLAN: As she has good restriction, she did not want an adjustment today. We'll see her in 4 months after she recovers from neck surgery.

## 2011-09-18 ENCOUNTER — Encounter: Payer: Self-pay | Admitting: *Deleted

## 2011-09-30 DIAGNOSIS — Z01810 Encounter for preprocedural cardiovascular examination: Secondary | ICD-10-CM

## 2011-10-22 ENCOUNTER — Ambulatory Visit: Payer: Medicare Other | Attending: Neurosurgery | Admitting: Physical Therapy

## 2011-10-22 DIAGNOSIS — R269 Unspecified abnormalities of gait and mobility: Secondary | ICD-10-CM | POA: Insufficient documentation

## 2011-10-22 DIAGNOSIS — R5381 Other malaise: Secondary | ICD-10-CM | POA: Insufficient documentation

## 2011-10-22 DIAGNOSIS — IMO0001 Reserved for inherently not codable concepts without codable children: Secondary | ICD-10-CM | POA: Insufficient documentation

## 2011-10-22 DIAGNOSIS — M542 Cervicalgia: Secondary | ICD-10-CM | POA: Insufficient documentation

## 2011-10-27 ENCOUNTER — Ambulatory Visit: Payer: Medicare Other | Attending: Neurosurgery | Admitting: *Deleted

## 2011-10-27 DIAGNOSIS — R269 Unspecified abnormalities of gait and mobility: Secondary | ICD-10-CM | POA: Insufficient documentation

## 2011-10-27 DIAGNOSIS — M542 Cervicalgia: Secondary | ICD-10-CM | POA: Insufficient documentation

## 2011-10-27 DIAGNOSIS — R5381 Other malaise: Secondary | ICD-10-CM | POA: Insufficient documentation

## 2011-10-27 DIAGNOSIS — IMO0001 Reserved for inherently not codable concepts without codable children: Secondary | ICD-10-CM | POA: Insufficient documentation

## 2011-10-29 ENCOUNTER — Ambulatory Visit: Payer: Medicare Other | Admitting: Physical Therapy

## 2011-11-02 ENCOUNTER — Ambulatory Visit: Payer: Medicare Other | Admitting: Physical Therapy

## 2011-11-04 ENCOUNTER — Encounter: Payer: Medicare Other | Admitting: Physical Therapy

## 2011-11-06 ENCOUNTER — Ambulatory Visit: Payer: Medicare Other | Admitting: Physical Therapy

## 2011-11-09 ENCOUNTER — Encounter: Payer: Medicare Other | Admitting: Physical Therapy

## 2011-11-10 ENCOUNTER — Ambulatory Visit: Payer: Medicare Other | Admitting: Physical Therapy

## 2011-11-11 ENCOUNTER — Ambulatory Visit: Payer: Medicare Other | Admitting: Physical Therapy

## 2011-11-13 ENCOUNTER — Ambulatory Visit: Payer: Medicare Other | Admitting: Physical Therapy

## 2011-11-16 ENCOUNTER — Ambulatory Visit: Payer: Medicare Other | Admitting: Physical Therapy

## 2011-11-19 ENCOUNTER — Ambulatory Visit: Payer: Medicare Other | Admitting: Physical Therapy

## 2011-11-20 ENCOUNTER — Encounter: Payer: Medicare Other | Admitting: Physical Therapy

## 2011-11-23 ENCOUNTER — Ambulatory Visit: Payer: Medicare Other | Attending: Neurosurgery | Admitting: Physical Therapy

## 2011-11-23 DIAGNOSIS — IMO0001 Reserved for inherently not codable concepts without codable children: Secondary | ICD-10-CM | POA: Insufficient documentation

## 2011-11-23 DIAGNOSIS — R5381 Other malaise: Secondary | ICD-10-CM | POA: Insufficient documentation

## 2011-11-23 DIAGNOSIS — R269 Unspecified abnormalities of gait and mobility: Secondary | ICD-10-CM | POA: Insufficient documentation

## 2011-11-23 DIAGNOSIS — M542 Cervicalgia: Secondary | ICD-10-CM | POA: Insufficient documentation

## 2011-11-25 ENCOUNTER — Encounter: Payer: Medicare Other | Admitting: Physical Therapy

## 2011-11-30 ENCOUNTER — Ambulatory Visit: Payer: Medicare Other | Attending: Neurosurgery | Admitting: Physical Therapy

## 2011-11-30 DIAGNOSIS — R5381 Other malaise: Secondary | ICD-10-CM | POA: Insufficient documentation

## 2011-11-30 DIAGNOSIS — M542 Cervicalgia: Secondary | ICD-10-CM | POA: Insufficient documentation

## 2011-11-30 DIAGNOSIS — IMO0001 Reserved for inherently not codable concepts without codable children: Secondary | ICD-10-CM | POA: Insufficient documentation

## 2011-11-30 DIAGNOSIS — R269 Unspecified abnormalities of gait and mobility: Secondary | ICD-10-CM | POA: Insufficient documentation

## 2011-12-03 ENCOUNTER — Ambulatory Visit: Payer: Medicare Other | Admitting: Physical Therapy

## 2011-12-07 ENCOUNTER — Ambulatory Visit: Payer: Medicare Other | Admitting: Physical Therapy

## 2011-12-07 ENCOUNTER — Encounter: Payer: Medicare Other | Admitting: Physical Therapy

## 2011-12-09 ENCOUNTER — Encounter: Payer: Medicare Other | Admitting: Physical Therapy

## 2011-12-24 ENCOUNTER — Encounter: Payer: Self-pay | Admitting: Gastroenterology

## 2011-12-28 ENCOUNTER — Encounter: Payer: Medicare Other | Admitting: *Deleted

## 2011-12-29 ENCOUNTER — Encounter: Payer: Medicare Other | Admitting: Physical Therapy

## 2011-12-30 ENCOUNTER — Encounter: Payer: Self-pay | Admitting: Gastroenterology

## 2012-01-14 ENCOUNTER — Encounter (INDEPENDENT_AMBULATORY_CARE_PROVIDER_SITE_OTHER): Payer: Medicare Other

## 2012-02-03 ENCOUNTER — Encounter: Payer: Medicare Other | Admitting: Gastroenterology

## 2012-02-11 ENCOUNTER — Encounter (INDEPENDENT_AMBULATORY_CARE_PROVIDER_SITE_OTHER): Payer: Self-pay

## 2012-03-03 ENCOUNTER — Encounter (INDEPENDENT_AMBULATORY_CARE_PROVIDER_SITE_OTHER): Payer: Self-pay | Admitting: Physician Assistant

## 2012-03-03 ENCOUNTER — Ambulatory Visit (INDEPENDENT_AMBULATORY_CARE_PROVIDER_SITE_OTHER): Payer: Medicare Other | Admitting: Physician Assistant

## 2012-03-03 VITALS — BP 124/80 | HR 92 | Temp 97.6°F | Resp 18 | Ht 63.5 in | Wt 262.0 lb

## 2012-03-03 DIAGNOSIS — Z4651 Encounter for fitting and adjustment of gastric lap band: Secondary | ICD-10-CM

## 2012-03-03 NOTE — Progress Notes (Signed)
  HISTORY: Miranda Keith is a 67 y.o.female who received an Ap-Large lap-band in October 2011 by Dr. Daphine Deutscher. She comes in with 2-3 episodes of regurgitation per week. She's lost 5 lbs since July. In the intervening time she's had neck surgery which has improved her pain symptoms. She's not particularly hungry but she continues to be frustrated with her lack of progress in weight loss. She has trouble with drier sources of protein.  VITAL SIGNS: Filed Vitals:   03/03/12 1215  BP: 124/80  Pulse: 92  Temp: 97.6 F (36.4 C)  Resp: 18    PHYSICAL EXAM: Physical exam reveals a very well-appearing 67 y.o.female in no apparent distress Neurologic: Awake, alert, oriented Psych: Bright affect, conversant Respiratory: Breathing even and unlabored. No stridor or wheezing Abdomen: Soft, nontender, nondistended to palpation. Incisions well-healed. No incisional hernias. Port easily palpated. Extremities: Atraumatic, good range of motion.  ASSESMENT: 67 y.o.  female  s/p AP-Large lap-band.   PLAN: The patient's port was accessed with a 20G Huber needle without difficulty. Clear fluid was aspirated and 0.25 mL saline was removed from the port. Hopefully this will alleviate her regurgitation symptoms and allow her to take lean sources of protein in an attempt to reduce weight. I think an appointment with Dr. Daphine Deutscher in the next few months would be helpful for her to see if there are other weight loss strategies to investigate with the lap band in place.

## 2012-03-03 NOTE — Patient Instructions (Signed)
Focus on good food choices as well as physical activity. Return sooner if you have an increase in hunger, portion sizes or weight. Return also for difficulty swallowing, night cough, reflux.   

## 2012-03-21 ENCOUNTER — Encounter: Payer: Self-pay | Admitting: Gastroenterology

## 2012-03-25 ENCOUNTER — Ambulatory Visit (AMBULATORY_SURGERY_CENTER): Payer: Medicare Other | Admitting: *Deleted

## 2012-03-25 VITALS — Ht 63.5 in | Wt 265.8 lb

## 2012-03-25 DIAGNOSIS — Z1211 Encounter for screening for malignant neoplasm of colon: Secondary | ICD-10-CM

## 2012-03-25 MED ORDER — MOVIPREP 100 G PO SOLR: ORAL | Status: DC

## 2012-04-04 ENCOUNTER — Telehealth: Payer: Self-pay | Admitting: Gastroenterology

## 2012-04-04 NOTE — Telephone Encounter (Signed)
lmom for pt to call back. Her appt was already cancelled.

## 2012-04-06 ENCOUNTER — Encounter: Payer: Medicare Other | Admitting: Gastroenterology

## 2012-04-19 NOTE — Telephone Encounter (Signed)
Pt r/s to 04/20/12.

## 2012-04-20 ENCOUNTER — Encounter: Payer: Self-pay | Admitting: Gastroenterology

## 2012-04-20 ENCOUNTER — Ambulatory Visit (AMBULATORY_SURGERY_CENTER): Payer: Medicare Other | Admitting: Gastroenterology

## 2012-04-20 VITALS — BP 135/67 | HR 66 | Temp 96.9°F | Resp 16 | Ht 63.0 in | Wt 265.0 lb

## 2012-04-20 DIAGNOSIS — D126 Benign neoplasm of colon, unspecified: Secondary | ICD-10-CM

## 2012-04-20 MED ORDER — SODIUM CHLORIDE 0.9 % IV SOLN: 500.0000 mL | INTRAVENOUS | Status: DC

## 2012-04-20 NOTE — Op Note (Signed)
[facility] [address] [place], [address]   COLONOSCOPY PROCEDURE REPORT  PATIENT: Miranda Keith, Miranda Keith  MR#: [id number] BIRTHDATE: 02/07/1946 , 66  yrs. old GENDER: Female ENDOSCOPIST: Mardella Layman, MD, [facility] REFERRED BY: PROCEDURE DATE:  04/20/2012 PROCEDURE:   Colonoscopy with snare polypectomy ASA CLASS:   Class II INDICATIONS:Patient's personal history of adenomatous colon polyps and Colorectal cancer screening. MEDICATIONS: propofol (Diprivan) 300mg  IV  DESCRIPTION OF PROCEDURE:   After the risks and benefits and of the procedure were explained, informed consent was obtained.  A digital rectal exam revealed no abnormalities of the rectum.    The LB CF-[id number]  endoscope was introduced through the anus and advanced to the cecum, which was identified by both the appendix and ileocecal valve .  The quality of the prep was good, using MoviPrep .  The instrument was then slowly withdrawn as the colon was fully examined.     COLON FINDINGS: Three smooth sessile polyps ranging between 3-28mm in size were found at the cecum and in the right colon.  A polypectomy was performed with a cold snare.  The resection was complete and the polyp tissue was completely retrieved.   Mild diverticulosis was noted in the descending colon and sigmoid colon. Retroflexed views revealed no abnormalities.     The scope was then withdrawn from the patient and the procedure completed.  COMPLICATIONS: There were no complications. ENDOSCOPIC IMPRESSION: 1.   Three sessile polyps ranging between 3-56mm in size were found at the cecum and in the left colon; polypectomy was performed with a cold snare ...recurreny adenomas 2.   Mild diverticulosis was noted in the descending colon and sigmoid colon  RECOMMENDATIONS: 1.  Await biopsy results 2.  Repeat Colonoscopy in 3 years. 3.  High fiber diet   REPEAT EXAM:  cc:W.  Buren Kos,  MD  _______________________________ eSigned:  Mardella Layman, MD, [facility] 04/20/2012 9:46 AM     PATIENT NAME:  Miranda Keith, Miranda Keith MR#: [id number]

## 2012-04-20 NOTE — Progress Notes (Signed)
Called to room to assist during endoscopic procedure.  Patient ID and intended procedure confirmed with present staff. Received instructions for my participation in the procedure from the performing physician.  

## 2012-04-20 NOTE — Patient Instructions (Addendum)
Handouts were given to your care partner on polyps, diverticulosis and a high fiber diet.  You may resume your current medications today.  Please call if any questions or concerns.    YOU HAD AN ENDOSCOPIC PROCEDURE TODAY AT THE [facility]: Refer to the procedure report that was given to you for any specific questions about what was found during the examination.  If the procedure report does not answer your questions, please call your gastroenterologist to clarify.  If you requested that your care partner not be given the details of your procedure findings, then the procedure report has been included in a sealed envelope for you to review at your convenience later.  YOU SHOULD EXPECT: Some feelings of bloating in the abdomen. Passage of more gas than usual.  Walking can help get rid of the air that was put into your GI tract during the procedure and reduce the bloating. If you had a lower endoscopy (such as a colonoscopy or flexible sigmoidoscopy) you may notice spotting of blood in your stool or on the toilet paper. If you underwent a bowel prep for your procedure, then you may not have a normal bowel movement for a few days.  DIET: Your first meal following the procedure should be a light meal and then it is ok to progress to your normal diet.  A half-sandwich or bowl of soup is an example of a good first meal.  Heavy or fried foods are harder to digest and may make you feel nauseous or bloated.  Likewise meals heavy in dairy and vegetables can cause extra gas to form and this can also increase the bloating.  Drink plenty of fluids but you should avoid alcoholic beverages for 24 hours.  ACTIVITY: Your care partner should take you home directly after the procedure.  You should plan to take it easy, moving slowly for the rest of the day.  You can resume normal activity the day after the procedure however you should NOT DRIVE or use heavy machinery for 24 hours (because of the sedation  medicines used during the test).    SYMPTOMS TO REPORT IMMEDIATELY: A gastroenterologist can be reached at any hour.  During normal business hours, 8:30 AM to 5:00 PM Monday through Friday, call [phone].  After hours and on weekends, please call the GI answering service at [phone] who will take a message and have the physician on call contact you.   Following lower endoscopy (colonoscopy or flexible sigmoidoscopy):  Excessive amounts of blood in the stool  Significant tenderness or worsening of abdominal pains  Swelling of the abdomen that is new, acute  Fever of 100F or higher   FOLLOW UP: If any biopsies were taken you will be contacted by phone or by letter within the next 1-3 weeks.  Call your gastroenterologist if you have not heard about the biopsies in 3 weeks.  Our staff will call the home number listed on your records the next business day following your procedure to check on you and address any questions or concerns that you may have at that time regarding the information given to you following your procedure. This is a courtesy call and so if there is no answer at the home number and we have not heard from you through the emergency physician on call, we will assume that you have returned to your regular daily activities without incident.  SIGNATURES/CONFIDENTIALITY: You and/or your care partner have signed paperwork which will be entered into  your electronic medical record.  These signatures attest to the fact that that the information above on your After Visit Summary has been reviewed and is understood.  Full responsibility of the confidentiality of this discharge information lies with you and/or your care-partner.

## 2012-04-20 NOTE — Progress Notes (Signed)
Patient did not experience any of the following events: a burn prior to discharge; a fall within the facility; wrong site/side/patient/procedure/implant event; or a hospital transfer or hospital admission upon discharge from the facility. ([id number])Patient did not have preoperative order for IV antibiotic SSI prophylaxis. [phone])   No complaints noted in the recovery room. Maw

## 2012-04-21 ENCOUNTER — Telehealth: Payer: Self-pay | Admitting: *Deleted

## 2012-04-21 NOTE — Telephone Encounter (Signed)
  Follow up Call-  Call back number 04/20/2012  Post procedure Call Back phone  # [phone]  Permission to leave phone message Yes     Patient questions:  Do you have a fever, pain , or abdominal swelling? no Pain Score  0 *  Have you tolerated food without any problems? yes  Have you been able to return to your normal activities? yes  Do you have any questions about your discharge instructions: Diet   no Medications  no Follow up visit  no  Do you have questions or concerns about your Care? no  Actions: * If pain score is 4 or above: No action needed, pain <4.

## 2012-04-28 ENCOUNTER — Encounter (INDEPENDENT_AMBULATORY_CARE_PROVIDER_SITE_OTHER): Payer: Medicare Other | Admitting: Surgery

## 2012-04-30 ENCOUNTER — Encounter: Payer: Self-pay | Admitting: Gastroenterology

## 2012-06-28 ENCOUNTER — Encounter (HOSPITAL_COMMUNITY): Payer: Self-pay | Admitting: Pharmacy Technician

## 2012-06-29 NOTE — Progress Notes (Signed)
Need orders in EPIC when able please - pt coming for preop 5/14 - thank you

## 2012-06-30 ENCOUNTER — Other Ambulatory Visit: Payer: Self-pay | Admitting: Orthopedic Surgery

## 2012-06-30 MED ORDER — DEXAMETHASONE SODIUM PHOSPHATE 10 MG/ML IJ SOLN: 10.0000 mg | Freq: Once | INTRAMUSCULAR | Status: DC

## 2012-06-30 MED ORDER — BUPIVACAINE LIPOSOME 1.3 % IJ SUSP: 20.0000 mL | Freq: Once | INTRAMUSCULAR | Status: DC

## 2012-06-30 NOTE — Progress Notes (Signed)
Preoperative surgical orders have been place into the Epic hospital system for [employer] on 06/30/2012, 12:38 PM  by Patrica Duel for surgery on 07/13/2012.  Preop Total Knee orders including Experal, IV Tylenol, and IV Decadron as long as there are no contraindications to the above medications. Avel Peace, PA-C

## 2012-07-06 ENCOUNTER — Encounter (HOSPITAL_COMMUNITY): Payer: Self-pay

## 2012-07-06 ENCOUNTER — Encounter (HOSPITAL_COMMUNITY)
Admission: RE | Admit: 2012-07-06 | Discharge: 2012-07-06 | Disposition: A | Discharge status: Home / Self Care | Payer: Medicare Other | Source: Ambulatory Visit | Attending: Orthopedic Surgery | Admitting: Orthopedic Surgery

## 2012-07-06 HISTORY — DX: Sleep apnea, unspecified: G47.30

## 2012-07-06 HISTORY — DX: Unspecified malignant neoplasm of skin, unspecified: C44.90

## 2012-07-06 HISTORY — DX: Polyneuropathy, unspecified: G62.9

## 2012-07-06 LAB — URINALYSIS, ROUTINE W REFLEX MICROSCOPIC
Glucose, UA: NEGATIVE mg/dL
Ketones, ur: NEGATIVE mg/dL
Leukocytes, UA: NEGATIVE
Nitrite: NEGATIVE
Specific Gravity, Urine: 1.029 (ref 1.005–1.030)
pH: 6.5 (ref 5.0–8.0)

## 2012-07-06 LAB — COMPREHENSIVE METABOLIC PANEL
AST: 21 U/L (ref 0–37)
Albumin: 3.8 g/dL (ref 3.5–5.2)
Alkaline Phosphatase: 76 U/L (ref 39–117)
CO2: 29 mEq/L (ref 19–32)
Chloride: 99 mEq/L (ref 96–112)
Creatinine, Ser: 0.5 mg/dL (ref 0.50–1.10)
GFR calc non Af Amer: >90 mL/min (ref >90)
Potassium: 4 mEq/L (ref 3.5–5.1)
Total Bilirubin: 0.6 mg/dL (ref 0.3–1.2)

## 2012-07-06 LAB — CBC
HCT: 43.7 % (ref 36.0–46.0)
MCV: 89 fL (ref 78.0–100.0)
Platelets: 245 10*3/uL (ref 150–400)
RBC: 4.91 MIL/uL (ref 3.87–5.11)
RDW: 12.7 % (ref 11.5–15.5)
WBC: 6.7 10*3/uL (ref 4.0–10.5)

## 2012-07-06 LAB — APTT: aPTT: 30 seconds (ref 24–37)

## 2012-07-06 LAB — PROTIME-INR: INR: 0.99 (ref 0.00–1.49)

## 2012-07-06 LAB — SURGICAL PCR SCREEN
MRSA, PCR: NEGATIVE
Staphylococcus aureus: NEGATIVE

## 2012-07-06 NOTE — Patient Instructions (Addendum)
Stori G Albanese  07/06/2012                           YOUR PROCEDURE IS SCHEDULED ON: 07/13/12               PLEASE REPORT TO SHORT STAY CENTER AT :  10:00 AM               CALL THIS NUMBER IF ANY PROBLEMS THE DAY OF SURGERY :               [phone]                      REMEMBER:   Do not eat food or drink liquids AFTER MIDNIGHT  May have clear liquids UNTIL 6 HOURS BEFORE SURGERY (7:00 AM)  Clear liquids include soda, tea, black coffee, apple or grape juice, broth.  Take these medicines the morning of surgery with A SIP OF WATER: ZYRTEC / MAY TAKE XANAX OR HYDROCODONE IF NEEDED   Do not wear jewelry, make-up   Do not wear lotions, powders, or perfumes.   Do not shave legs or underarms 12 hrs. before surgery (men may shave face)  Do not bring valuables to the hospital.  Contacts, dentures or bridgework may not be worn into surgery.  Leave suitcase in the car. After surgery it may be brought to your room.  For patients admitted to the hospital more than one night, checkout time is 11:00                          The day of discharge.   Patients discharged the day of surgery will not be allowed to drive home                             If going home same day of surgery, must have someone stay with you first                           24 hrs at home and arrange for some one to drive you home from hospital.    Special Instructions:   Please read over the following fact sheets that you were given:               1. MRSA  INFORMATION                      2. [place] PREPARING FOR SURGERY SHEET               3. INCENTIVE SPIROMETER                                                X_____________________________________________________________________        Failure to follow these instructions may result in cancellation of your surgery

## 2012-07-10 ENCOUNTER — Other Ambulatory Visit: Payer: Self-pay | Admitting: Surgical

## 2012-07-10 NOTE — H&P (Signed)
TOTAL KNEE ADMISSION H&P  Patient is being admitted for left total knee arthroplasty.  Subjective:  Chief Complaint:left knee pain.  HPI: Miranda Keith, 67 y.o. female, has a history of pain and functional disability in the left knee due to arthritis and has failed non-surgical conservative treatments for greater than 12 weeks to includeNSAID's and/or analgesics, corticosteriod injections and activity modification.  Onset of symptoms was gradual, starting 7 years ago with gradually worsening course since that time. The patient noted no past surgery on the left knee(s).  Patient currently rates pain in the left knee(s) at 7 out of 10 with activity. Patient has night pain, worsening of pain with activity and weight bearing, pain that interferes with activities of daily living, pain with passive range of motion, crepitus and joint swelling.  Patient has evidence of periarticular osteophytes and joint space narrowing by imaging studies. There is no active infection.  Patient Active Problem List   Diagnosis Date Noted  . OSA (obstructive sleep apnea) 10/21/2010  . COLONIC POLYPS, HYPERPLASTIC 03/12/2007  . HYPERCHOLESTEROLEMIA 03/12/2007  . OBESITY 03/12/2007  . CONSTIPATION 03/12/2007  . IRRITABLE BOWEL SYNDROME 03/12/2007  . MELANOSIS COLI 03/12/2007  . ARTHRITIS 03/12/2007  . BACK PAIN, CHRONIC 03/12/2007  . ABDOMINAL PAIN, LOWER 03/12/2007  . ESOPHAGITIS, REFLUX 01/17/2007  . HIATAL HERNIA 01/17/2007  . DIVERTICULOSIS, COLON 01/17/2007   Past Medical History  Diagnosis Date  . Joint pain   . Allergic rhinitis   . Arthritis   . Degenerative disc disease   . Obesity   . Fibromyalgia     Reported by pt on 08/27/09  . Complication of anesthesia     "memory problems"  . Neuropathy     hands  . Skin cancer   . Sleep apnea     "mild sleep apnea" unable to tolerate c-pap    Past Surgical History  Procedure Laterality Date  . Neck fusion  2001, 2013  . Back surgery      lower back   . Total knee arthroplasty      right  . Laparoscopic gastric banding  12/03/09  . Total abdominal hysterectomy  1994  . Appendectomy       Current outpatient prescriptions: ALPRAZolam (XANAX) 0.25 MG tablet, Take 0.25 mg by mouth daily as needed for anxiety. , Disp: , Rfl: ;  calcium-vitamin D (OSCAL WITH D) 500-200 MG-UNIT per tablet, Take 1 tablet by mouth daily.  , Disp: , Rfl: ;  cetirizine (ZYRTEC) 10 MG tablet, Take 10 mg by mouth daily., Disp: , Rfl: ;  cholecalciferol (VITAMIN D) 1000 UNITS tablet, Take 1,000 Units by mouth daily., Disp: , Rfl:  fish oil-omega-3 fatty acids 1000 MG capsule, Take 1 g by mouth daily., Disp: , Rfl: ;  furosemide (LASIX) 20 MG tablet, Take 20 mg by mouth daily as needed (for fluid). , Disp: , Rfl: ;  HYDROcodone-acetaminophen (NORCO) 10-325 MG per tablet, Take 1 tablet by mouth every 8 (eight) hours as needed for pain. , Disp: , Rfl: ;  Magnesium 400 MG CAPS, Take 400 mg by mouth 3 (three) times daily., Disp: , Rfl:  potassium chloride (K-DUR) 10 MEQ tablet, Take 10 mEq by mouth daily as needed. Only takes when taking lasix, Disp: , Rfl: ;  vitamin C (ASCORBIC ACID) 500 MG tablet, Take 500 mg by mouth daily., Disp: , Rfl:   No Known Allergies  History  Substance Use Topics  . Smoking status: Former Smoker    Types: Cigarettes  Quit date: 02/23/1978  . Smokeless tobacco: Former Neurosurgeon    Quit date: 10/03/1990     Comment: quit 1980s.  only smoked' rarely" - couldn't remember how many cigs x how many yrs.   . Alcohol Use: No    Family History  Problem Relation Age of Onset  . Allergies Mother   . Allergies Other     grandchildren  . Colon cancer Father   . Colon cancer Paternal Aunt   . Colon cancer Paternal Grandmother   . Stomach cancer Neg Hx      Review of Systems  Constitutional: Negative.   HENT: Negative.  Negative for neck pain.   Eyes: Negative.   Respiratory: Negative.   Gastrointestinal: Negative.   Genitourinary: Negative.    Musculoskeletal: Positive for back pain and joint pain. Negative for myalgias and falls.       Left knee pain  Skin: Negative.   Neurological: Positive for dizziness. Negative for tingling, tremors, sensory change, speech change, focal weakness, seizures and loss of consciousness.  Endo/Heme/Allergies: Negative.   Psychiatric/Behavioral: Negative.     Objective:  Physical Exam  Constitutional: She is oriented to person, place, and time. She appears well-developed and well-nourished. No distress.  HENT:  Head: Normocephalic and atraumatic.  Right Ear: External ear normal.  Left Ear: External ear normal.  Nose: Nose normal.  Mouth/Throat: Oropharynx is clear and moist.  Eyes: Conjunctivae are normal.  Neck: Normal range of motion. Neck supple. No tracheal deviation present. No thyromegaly present.  Cardiovascular: Normal rate, regular rhythm, normal heart sounds and intact distal pulses.   No murmur heard. Respiratory: Effort normal and breath sounds normal. No respiratory distress. She has no wheezes.  GI: Soft. Bowel sounds are normal. She exhibits no distension and no mass. There is no tenderness.  Musculoskeletal:       Right hip: Normal.       Left hip: She exhibits bony tenderness. She exhibits normal range of motion and normal strength.       Right knee: Normal.       Left knee: She exhibits decreased range of motion and swelling. She exhibits no effusion and no erythema. Tenderness found. Medial joint line and lateral joint line tenderness noted.       Right lower leg: She exhibits no tenderness and no swelling.       Left lower leg: She exhibits no tenderness and no swelling.   Evaluation of her hips shows normal ROM and no discomfort. She has slight trochanteric tenderness on the left side. She does not have any pain on provocative testing of the left hip. Her right knee shows a well-healed scar, range about 0-110 with no tenderness or instability. Left knee is in slight  varus. Range is 5-110. Marked crepitus on ROM. Tender medial greater than lateral. No instability noted.   Lymphadenopathy:    She has no cervical adenopathy.  Neurological: She is alert and oriented to person, place, and time. She has normal strength and normal reflexes. No sensory deficit.  Skin: No rash noted. She is not diaphoretic. No erythema.  Psychiatric: She has a normal mood and affect. Her behavior is normal.    Vitals Pulse: 88 (Regular) BP: 138/84 (Sitting, Left Arm, Standard)   Estimated body mass index is 46.95 kg/(m^2) as calculated from the following:   Height as of 04/20/12: 5\' 3"  (1.6 m).   Weight as of 04/20/12: 120.203 kg (265 lb).   Imaging Review Plain radiographs demonstrate severe  degenerative joint disease of the left knee(s). The overall alignment ismild varus. The bone quality appears to be fair for age and reported activity level.  Assessment/Plan:  End stage arthritis, left knee   The patient history, physical examination, clinical judgment of the provider and imaging studies are consistent with end stage degenerative joint disease of the left knee(s) and total knee arthroplasty is deemed medically necessary. The treatment options including medical management, injection therapy arthroscopy and arthroplasty were discussed at length. The risks and benefits of total knee arthroplasty were presented and reviewed. The risks due to aseptic loosening, infection, stiffness, patella tracking problems, thromboembolic complications and other imponderables were discussed. The patient acknowledged the explanation, agreed to proceed with the plan and consent was signed. Patient is being admitted for inpatient treatment for surgery, pain control, PT, OT, prophylactic antibiotics, VTE prophylaxis, progressive ambulation and ADL's and discharge planning. The patient is planning to be discharged home with home health services   [place], [place]

## 2012-07-13 ENCOUNTER — Encounter (HOSPITAL_COMMUNITY): Payer: Self-pay | Admitting: *Deleted

## 2012-07-13 ENCOUNTER — Encounter (HOSPITAL_COMMUNITY): Payer: Self-pay | Admitting: Anesthesiology

## 2012-07-13 ENCOUNTER — Inpatient Hospital Stay (HOSPITAL_COMMUNITY)
Admission: RE | Admit: 2012-07-13 | Discharge: 2012-07-17 | DRG: 470 | Disposition: A | Discharge status: Home Health | Payer: Medicare Other | Source: Ambulatory Visit | Attending: Orthopedic Surgery | Admitting: Orthopedic Surgery

## 2012-07-13 ENCOUNTER — Inpatient Hospital Stay (HOSPITAL_COMMUNITY): Payer: Medicare Other | Admitting: Anesthesiology

## 2012-07-13 ENCOUNTER — Encounter (HOSPITAL_COMMUNITY)
Admission: RE | Disposition: A | Discharge status: Home Health | Payer: Self-pay | Source: Ambulatory Visit | Attending: Orthopedic Surgery

## 2012-07-13 DIAGNOSIS — M179 Osteoarthritis of knee, unspecified: Secondary | ICD-10-CM | POA: Diagnosis present

## 2012-07-13 DIAGNOSIS — E7800 Pure hypercholesterolemia, unspecified: Secondary | ICD-10-CM | POA: Diagnosis present

## 2012-07-13 DIAGNOSIS — Z6841 Body Mass Index (BMI) 40.0 and over, adult: Secondary | ICD-10-CM

## 2012-07-13 DIAGNOSIS — Z96659 Presence of unspecified artificial knee joint: Secondary | ICD-10-CM

## 2012-07-13 DIAGNOSIS — Z01812 Encounter for preprocedural laboratory examination: Secondary | ICD-10-CM

## 2012-07-13 DIAGNOSIS — M1710 Unilateral primary osteoarthritis, unspecified knee: Principal | ICD-10-CM | POA: Diagnosis present

## 2012-07-13 DIAGNOSIS — G4733 Obstructive sleep apnea (adult) (pediatric): Secondary | ICD-10-CM | POA: Diagnosis present

## 2012-07-13 DIAGNOSIS — Z96652 Presence of left artificial knee joint: Secondary | ICD-10-CM

## 2012-07-13 DIAGNOSIS — M25579 Pain in unspecified ankle and joints of unspecified foot: Secondary | ICD-10-CM | POA: Diagnosis not present

## 2012-07-13 DIAGNOSIS — Z87891 Personal history of nicotine dependence: Secondary | ICD-10-CM

## 2012-07-13 HISTORY — PX: TOTAL KNEE ARTHROPLASTY: SHX125

## 2012-07-13 LAB — TYPE AND SCREEN

## 2012-07-13 SURGERY — ARTHROPLASTY, KNEE, TOTAL
Anesthesia: Spinal | Site: Knee | Laterality: Left | Wound class: Clean

## 2012-07-13 MED ORDER — DEXTROSE 5 % IV SOLN
500.0000 mg | Freq: Four times a day (QID) | INTRAVENOUS | Status: DC | PRN
  Administered 2012-07-13: 500 mg via INTRAVENOUS
  Filled 2012-07-13 (×2): qty 5

## 2012-07-13 MED ORDER — METOCLOPRAMIDE HCL 10 MG PO TABS: 5.0000 mg | ORAL_TABLET | Freq: Three times a day (TID) | ORAL | Status: DC | PRN

## 2012-07-13 MED ORDER — ROCURONIUM BROMIDE 100 MG/10ML IV SOLN
INTRAVENOUS | Status: DC | PRN
  Administered 2012-07-13: 20 mg via INTRAVENOUS

## 2012-07-13 MED ORDER — ALPRAZOLAM 0.25 MG PO TABS
0.2500 mg | ORAL_TABLET | Freq: Every day | ORAL | Status: DC | PRN
  Administered 2012-07-14 – 2012-07-15 (×2): 0.25 mg via ORAL
  Filled 2012-07-13 (×2): qty 1

## 2012-07-13 MED ORDER — NEOSTIGMINE METHYLSULFATE 1 MG/ML IJ SOLN
INTRAMUSCULAR | Status: DC | PRN
  Administered 2012-07-13: 4 mg via INTRAVENOUS

## 2012-07-13 MED ORDER — ONDANSETRON HCL 4 MG/2ML IJ SOLN
INTRAMUSCULAR | Status: DC | PRN
  Administered 2012-07-13: 4 mg via INTRAVENOUS

## 2012-07-13 MED ORDER — TRANEXAMIC ACID 100 MG/ML IV SOLN
1000.0000 mg | INTRAVENOUS | Status: AC
  Administered 2012-07-13: 1000 mg via INTRAVENOUS
  Filled 2012-07-13: qty 10

## 2012-07-13 MED ORDER — ACETAMINOPHEN 650 MG RE SUPP: 650.0000 mg | Freq: Four times a day (QID) | RECTAL | Status: DC | PRN

## 2012-07-13 MED ORDER — ACETAMINOPHEN 325 MG PO TABS
650.0000 mg | ORAL_TABLET | Freq: Four times a day (QID) | ORAL | Status: DC | PRN
  Administered 2012-07-16: 650 mg via ORAL
  Filled 2012-07-13: qty 2

## 2012-07-13 MED ORDER — BISACODYL 10 MG RE SUPP
10.0000 mg | Freq: Every day | RECTAL | Status: DC | PRN
  Filled 2012-07-13: qty 1

## 2012-07-13 MED ORDER — BUPIVACAINE LIPOSOME 1.3 % IJ SUSP
20.0000 mL | Freq: Once | INTRAMUSCULAR | Status: DC
  Filled 2012-07-13: qty 20

## 2012-07-13 MED ORDER — SUCCINYLCHOLINE CHLORIDE 20 MG/ML IJ SOLN
INTRAMUSCULAR | Status: DC | PRN
  Administered 2012-07-13: 100 mg via INTRAVENOUS

## 2012-07-13 MED ORDER — TRAMADOL HCL 50 MG PO TABS
50.0000 mg | ORAL_TABLET | Freq: Four times a day (QID) | ORAL | Status: DC | PRN
  Administered 2012-07-16: 50 mg via ORAL
  Administered 2012-07-17: 100 mg via ORAL
  Filled 2012-07-13: qty 2
  Filled 2012-07-13: qty 1

## 2012-07-13 MED ORDER — LACTATED RINGERS IV SOLN: INTRAVENOUS | Status: DC

## 2012-07-13 MED ORDER — KCL IN DEXTROSE-NACL 20-5-0.9 MEQ/L-%-% IV SOLN
INTRAVENOUS | Status: DC
  Administered 2012-07-13 – 2012-07-14 (×3): via INTRAVENOUS
  Filled 2012-07-13 (×7): qty 1000

## 2012-07-13 MED ORDER — DIPHENHYDRAMINE HCL 12.5 MG/5ML PO ELIX: 12.5000 mg | ORAL_SOLUTION | ORAL | Status: DC | PRN

## 2012-07-13 MED ORDER — FLEET ENEMA 7-19 GM/118ML RE ENEM: 1.0000 | ENEMA | Freq: Once | RECTAL | Status: AC | PRN

## 2012-07-13 MED ORDER — PROPOFOL 10 MG/ML IV BOLUS
INTRAVENOUS | Status: DC | PRN
  Administered 2012-07-13: 150 mg via INTRAVENOUS

## 2012-07-13 MED ORDER — RIVAROXABAN 10 MG PO TABS
10.0000 mg | ORAL_TABLET | Freq: Every day | ORAL | Status: DC
  Administered 2012-07-14 – 2012-07-17 (×4): 10 mg via ORAL
  Filled 2012-07-13 (×5): qty 1

## 2012-07-13 MED ORDER — METHOCARBAMOL 500 MG PO TABS
500.0000 mg | ORAL_TABLET | Freq: Four times a day (QID) | ORAL | Status: DC | PRN
  Administered 2012-07-14 – 2012-07-17 (×6): 500 mg via ORAL
  Filled 2012-07-13 (×6): qty 1

## 2012-07-13 MED ORDER — SODIUM CHLORIDE 0.9 % IJ SOLN
INTRAMUSCULAR | Status: DC | PRN
  Administered 2012-07-13: 30 mL

## 2012-07-13 MED ORDER — BUPIVACAINE HCL 0.25 % IJ SOLN
INTRAMUSCULAR | Status: DC | PRN
  Administered 2012-07-13: 20 mL

## 2012-07-13 MED ORDER — POLYETHYLENE GLYCOL 3350 17 G PO PACK
17.0000 g | PACK | Freq: Every day | ORAL | Status: DC | PRN
  Administered 2012-07-17: 17 g via ORAL

## 2012-07-13 MED ORDER — METOCLOPRAMIDE HCL 5 MG/ML IJ SOLN: 5.0000 mg | Freq: Three times a day (TID) | INTRAMUSCULAR | Status: DC | PRN

## 2012-07-13 MED ORDER — MENTHOL 3 MG MT LOZG
1.0000 | LOZENGE | OROMUCOSAL | Status: DC | PRN
  Filled 2012-07-13: qty 9

## 2012-07-13 MED ORDER — SODIUM CHLORIDE 0.9 % IV SOLN: INTRAVENOUS | Status: DC

## 2012-07-13 MED ORDER — MIDAZOLAM HCL 5 MG/5ML IJ SOLN
INTRAMUSCULAR | Status: DC | PRN
  Administered 2012-07-13: 2 mg via INTRAVENOUS

## 2012-07-13 MED ORDER — MORPHINE SULFATE 2 MG/ML IJ SOLN
1.0000 mg | INTRAMUSCULAR | Status: DC | PRN
  Administered 2012-07-13 – 2012-07-15 (×7): 2 mg via INTRAVENOUS
  Filled 2012-07-13 (×6): qty 1

## 2012-07-13 MED ORDER — ACETAMINOPHEN 10 MG/ML IV SOLN
1000.0000 mg | Freq: Once | INTRAVENOUS | Status: AC
  Administered 2012-07-13: 1000 mg via INTRAVENOUS

## 2012-07-13 MED ORDER — 0.9 % SODIUM CHLORIDE (POUR BTL) OPTIME
TOPICAL | Status: DC | PRN
  Administered 2012-07-13: 1000 mL

## 2012-07-13 MED ORDER — BUPIVACAINE LIPOSOME 1.3 % IJ SUSP
INTRAMUSCULAR | Status: DC | PRN
  Administered 2012-07-13: 20 mL

## 2012-07-13 MED ORDER — FENTANYL CITRATE 0.05 MG/ML IJ SOLN
INTRAMUSCULAR | Status: DC | PRN
  Administered 2012-07-13: 100 ug via INTRAVENOUS
  Administered 2012-07-13 (×5): 50 ug via INTRAVENOUS

## 2012-07-13 MED ORDER — SODIUM CHLORIDE 0.9 % IR SOLN
Status: DC | PRN
  Administered 2012-07-13: 1000 mL

## 2012-07-13 MED ORDER — ONDANSETRON HCL 4 MG PO TABS: 4.0000 mg | ORAL_TABLET | Freq: Four times a day (QID) | ORAL | Status: DC | PRN

## 2012-07-13 MED ORDER — FUROSEMIDE 20 MG PO TABS
20.0000 mg | ORAL_TABLET | Freq: Every day | ORAL | Status: DC | PRN
  Filled 2012-07-13: qty 1

## 2012-07-13 MED ORDER — PHENOL 1.4 % MT LIQD
1.0000 | OROMUCOSAL | Status: DC | PRN
  Filled 2012-07-13: qty 177

## 2012-07-13 MED ORDER — DEXAMETHASONE SODIUM PHOSPHATE 10 MG/ML IJ SOLN
10.0000 mg | Freq: Every day | INTRAMUSCULAR | Status: AC
  Filled 2012-07-13: qty 1

## 2012-07-13 MED ORDER — GLYCOPYRROLATE 0.2 MG/ML IJ SOLN
INTRAMUSCULAR | Status: DC | PRN
  Administered 2012-07-13: .65 mg via INTRAVENOUS

## 2012-07-13 MED ORDER — DEXTROSE 5 % IV SOLN
3.0000 g | INTRAVENOUS | Status: AC
  Administered 2012-07-13: 3 g via INTRAVENOUS
  Filled 2012-07-13: qty 3000

## 2012-07-13 MED ORDER — DEXAMETHASONE 6 MG PO TABS
10.0000 mg | ORAL_TABLET | Freq: Every day | ORAL | Status: AC
  Administered 2012-07-14: 10 mg via ORAL
  Filled 2012-07-13: qty 1

## 2012-07-13 MED ORDER — ONDANSETRON HCL 4 MG/2ML IJ SOLN: 4.0000 mg | Freq: Four times a day (QID) | INTRAMUSCULAR | Status: DC | PRN

## 2012-07-13 MED ORDER — HYDROMORPHONE HCL PF 1 MG/ML IJ SOLN
0.2500 mg | INTRAMUSCULAR | Status: DC | PRN
  Administered 2012-07-13: 0.25 mg via INTRAVENOUS
  Administered 2012-07-13: 0.5 mg via INTRAVENOUS
  Administered 2012-07-13: 0.25 mg via INTRAVENOUS

## 2012-07-13 MED ORDER — LACTATED RINGERS IV SOLN
INTRAVENOUS | Status: DC
  Administered 2012-07-13: 1000 mL via INTRAVENOUS

## 2012-07-13 MED ORDER — ACETAMINOPHEN 10 MG/ML IV SOLN
1000.0000 mg | Freq: Four times a day (QID) | INTRAVENOUS | Status: AC
  Administered 2012-07-13 – 2012-07-14 (×4): 1000 mg via INTRAVENOUS
  Filled 2012-07-13 (×6): qty 100

## 2012-07-13 MED ORDER — POTASSIUM CHLORIDE ER 10 MEQ PO TBCR
10.0000 meq | EXTENDED_RELEASE_TABLET | Freq: Every day | ORAL | Status: DC
  Administered 2012-07-13 – 2012-07-17 (×5): 10 meq via ORAL
  Filled 2012-07-13 (×5): qty 1

## 2012-07-13 MED ORDER — LACTATED RINGERS IV SOLN
INTRAVENOUS | Status: DC | PRN
  Administered 2012-07-13 (×2): via INTRAVENOUS

## 2012-07-13 MED ORDER — PROMETHAZINE HCL 25 MG/ML IJ SOLN: 6.2500 mg | INTRAMUSCULAR | Status: DC | PRN

## 2012-07-13 MED ORDER — CHLORHEXIDINE GLUCONATE 4 % EX LIQD
60.0000 mL | Freq: Once | CUTANEOUS | Status: DC
  Filled 2012-07-13: qty 60

## 2012-07-13 MED ORDER — OXYCODONE HCL 5 MG PO TABS
5.0000 mg | ORAL_TABLET | ORAL | Status: DC | PRN
  Administered 2012-07-13 – 2012-07-14 (×6): 10 mg via ORAL
  Filled 2012-07-13 (×6): qty 2

## 2012-07-13 MED ORDER — KETOROLAC TROMETHAMINE 15 MG/ML IJ SOLN
15.0000 mg | Freq: Four times a day (QID) | INTRAMUSCULAR | Status: DC | PRN
  Administered 2012-07-13: 15 mg via INTRAVENOUS

## 2012-07-13 MED ORDER — CEFAZOLIN SODIUM-DEXTROSE 2-3 GM-% IV SOLR
2.0000 g | Freq: Four times a day (QID) | INTRAVENOUS | Status: AC
  Administered 2012-07-13 – 2012-07-14 (×2): 2 g via INTRAVENOUS
  Filled 2012-07-13 (×2): qty 50

## 2012-07-13 MED ORDER — LORATADINE 10 MG PO TABS
10.0000 mg | ORAL_TABLET | Freq: Every day | ORAL | Status: DC
  Administered 2012-07-13 – 2012-07-17 (×5): 10 mg via ORAL
  Filled 2012-07-13 (×5): qty 1

## 2012-07-13 MED ORDER — DOCUSATE SODIUM 100 MG PO CAPS
100.0000 mg | ORAL_CAPSULE | Freq: Two times a day (BID) | ORAL | Status: DC
  Administered 2012-07-13 – 2012-07-17 (×8): 100 mg via ORAL

## 2012-07-13 SURGICAL SUPPLY — 57 items
BAG SPEC THK2 15X12 ZIP CLS (MISCELLANEOUS) ×1
BAG ZIPLOCK 12X15 (MISCELLANEOUS) ×2 IMPLANT
BANDAGE ELASTIC 6 VELCRO ST LF (GAUZE/BANDAGES/DRESSINGS) ×2 IMPLANT
BANDAGE ESMARK 6X9 LF (GAUZE/BANDAGES/DRESSINGS) ×1 IMPLANT
BLADE SAG 18X100X1.27 (BLADE) ×2 IMPLANT
BLADE SAW SGTL 11.0X1.19X90.0M (BLADE) ×2 IMPLANT
BNDG CMPR 9X6 STRL LF SNTH (GAUZE/BANDAGES/DRESSINGS) ×1
BNDG ESMARK 6X9 LF (GAUZE/BANDAGES/DRESSINGS) ×2
BOWL SMART MIX CTS (DISPOSABLE) ×2 IMPLANT
CEMENT HV SMART SET (Cement) ×4 IMPLANT
CLOTH BEACON ORANGE TIMEOUT ST (SAFETY) ×2 IMPLANT
CUFF TOURN SGL QUICK 34 (TOURNIQUET CUFF) ×2
CUFF TRNQT CYL 34X4X40X1 (TOURNIQUET CUFF) ×1 IMPLANT
DECANTER SPIKE VIAL GLASS SM (MISCELLANEOUS) ×2 IMPLANT
DRAPE EXTREMITY T 121X128X90 (DRAPE) ×2 IMPLANT
DRAPE POUCH INSTRU U-SHP 10X18 (DRAPES) ×2 IMPLANT
DRAPE U-SHAPE 47X51 STRL (DRAPES) ×2 IMPLANT
DRSG ADAPTIC 3X8 NADH LF (GAUZE/BANDAGES/DRESSINGS) ×2 IMPLANT
DRSG PAD ABDOMINAL 8X10 ST (GAUZE/BANDAGES/DRESSINGS) ×2 IMPLANT
DURAPREP 26ML APPLICATOR (WOUND CARE) ×2 IMPLANT
ELECT REM PT RETURN 9FT ADLT (ELECTROSURGICAL) ×2
ELECTRODE REM PT RTRN 9FT ADLT (ELECTROSURGICAL) ×1 IMPLANT
EVACUATOR 1/8 PVC DRAIN (DRAIN) ×2 IMPLANT
FACESHIELD LNG OPTICON STERILE (SAFETY) ×10 IMPLANT
GLOVE BIO SURGEON STRL SZ7.5 (GLOVE) IMPLANT
GLOVE BIO SURGEON STRL SZ8 (GLOVE) ×2 IMPLANT
GLOVE BIOGEL PI IND STRL 8 (GLOVE) ×1 IMPLANT
GLOVE BIOGEL PI INDICATOR 8 (GLOVE) ×1
GLOVE SURG SS PI 6.5 STRL IVOR (GLOVE) IMPLANT
GOWN STRL NON-REIN LRG LVL3 (GOWN DISPOSABLE) ×2 IMPLANT
GOWN STRL REIN XL XLG (GOWN DISPOSABLE) IMPLANT
HANDPIECE INTERPULSE COAX TIP (DISPOSABLE) ×2
IMMOBILIZER KNEE 20 (SOFTGOODS) ×2
IMMOBILIZER KNEE 20 THIGH 36 (SOFTGOODS) ×1 IMPLANT
KIT BASIN OR (CUSTOM PROCEDURE TRAY) ×2 IMPLANT
MANIFOLD NEPTUNE II (INSTRUMENTS) ×2 IMPLANT
NDL SAFETY ECLIPSE 18X1.5 (NEEDLE) ×2 IMPLANT
NEEDLE HYPO 18GX1.5 SHARP (NEEDLE) ×4
NS IRRIG 1000ML POUR BTL (IV SOLUTION) ×2 IMPLANT
PACK TOTAL JOINT (CUSTOM PROCEDURE TRAY) ×2 IMPLANT
PADDING CAST COTTON 6X4 STRL (CAST SUPPLIES) ×5 IMPLANT
POSITIONER SURGICAL ARM (MISCELLANEOUS) ×2 IMPLANT
SET HNDPC FAN SPRY TIP SCT (DISPOSABLE) ×1 IMPLANT
SPONGE GAUZE 4X4 12PLY (GAUZE/BANDAGES/DRESSINGS) ×2 IMPLANT
STRIP CLOSURE SKIN 1/2X4 (GAUZE/BANDAGES/DRESSINGS) ×4 IMPLANT
SUCTION FRAZIER 12FR DISP (SUCTIONS) ×2 IMPLANT
SUT ETHIBOND NAB CT1 #1 30IN (SUTURE) ×1 IMPLANT
SUT MNCRL AB 4-0 PS2 18 (SUTURE) ×2 IMPLANT
SUT VIC AB 2-0 CT1 27 (SUTURE) ×6
SUT VIC AB 2-0 CT1 TAPERPNT 27 (SUTURE) ×3 IMPLANT
SUT VLOC 180 0 24IN GS25 (SUTURE) ×2 IMPLANT
SYR 20CC LL (SYRINGE) ×2 IMPLANT
SYR 50ML LL SCALE MARK (SYRINGE) ×2 IMPLANT
TOWEL OR 17X26 10 PK STRL BLUE (TOWEL DISPOSABLE) ×4 IMPLANT
TRAY FOLEY CATH 14FRSI W/METER (CATHETERS) ×2 IMPLANT
WATER STERILE IRR 1500ML POUR (IV SOLUTION) ×3 IMPLANT
WRAP KNEE MAXI GEL POST OP (GAUZE/BANDAGES/DRESSINGS) ×2 IMPLANT

## 2012-07-13 NOTE — Op Note (Signed)
Pre-operative diagnosis- Osteoarthritis  Left knee(s)  Post-operative diagnosis- Osteoarthritis Left knee(s)  Procedure-  Left  Total Knee Arthroplasty  Surgeon- Gus Rankin. Shellie Goettl, MD  Assistant- Avel Peace, PA-C   Anesthesia-  General EBL-* No blood loss amount entered *  Drains Hemovac  Tourniquet time-  Total Tourniquet Time Documented: Thigh (Left) - 40 minutes Total: Thigh (Left) - 40 minutes    Complications- None  Condition-PACU - hemodynamically stable.   Brief Clinical Note  Miranda Keith is a 66 y.o. year old female with end stage OA of her left knee with progressively worsening pain and dysfunction. She has constant pain, with activity and at rest and significant functional deficits with difficulties even with ADLs. She has had extensive non-op management including analgesics, injections of cortisone and viscosupplements, and home exercise program, but remains in significant pain with significant dysfunction. Radiographs show bone on bone arthritis medial and patellofemoral. She presents now for left Total Knee Arthroplasty.    Procedure in detail---   The patient is brought into the operating room and positioned supine on the operating table. After successful administration of  General,   a tourniquet is placed high on the  Left thigh(s) and the lower extremity is prepped and draped in the usual sterile fashion. Time out is performed by the operating team and then the  Left lower extremity is wrapped in Esmarch, knee flexed and the tourniquet inflated to 300 mmHg.       A midline incision is made with a ten blade through the subcutaneous tissue to the level of the extensor mechanism. A fresh blade is used to make a medial parapatellar arthrotomy. Soft tissue over the proximal medial tibia is subperiosteally elevated to the joint line with a knife and into the semimembranosus bursa with a Cobb elevator. Soft tissue over the proximal lateral tibia is elevated with attention  being paid to avoiding the patellar tendon on the tibial tubercle. The patella is everted, knee flexed 90 degrees and the ACL and PCL are removed. Findings are bone on bone medial and patellofemoral with large medial and patellar osteophytes.        The drill is used to create a starting hole in the distal femur and the canal is thoroughly irrigated with sterile saline to remove the fatty contents. The 5 degree Left  valgus alignment guide is placed into the femoral canal and the distal femoral cutting block is pinned to remove 10 mm off the distal femur. Resection is made with an oscillating saw.      The tibia is subluxed forward and the menisci are removed. The extramedullary alignment guide is placed referencing proximally at the medial aspect of the tibial tubercle and distally along the second metatarsal axis and tibial crest. The block is pinned to remove 2mm off the more deficient medial  side. Resection is made with an oscillating saw. Size 4is the most appropriate size for the tibia and the proximal tibia is prepared with the modular drill and keel punch for that size.      The femoral sizing guide is placed and size 4 is most appropriate. Rotation is marked off the epicondylar axis and confirmed by creating a rectangular flexion gap at 90 degrees. The size 4 cutting block is pinned in this rotation and the anterior, posterior and chamfer cuts are made with the oscillating saw. The intercondylar block is then placed and that cut is made.      Trial size 4 tibial component, trial  size 4 posterior stabilized femur and a 12.5  mm posterior stabilized rotating platform insert trial is placed. Full extension is achieved with excellent varus/valgus and anterior/posterior balance throughout full range of motion. The patella is everted and thickness measured to be 25  mm. Free hand resection is taken to 15 mm, a 38 template is placed, lug holes are drilled, trial patella is placed, and it tracks normally.  Osteophytes are removed off the posterior femur with the trial in place. All trials are removed and the cut bone surfaces prepared with pulsatile lavage. Cement is mixed and once ready for implantation, the size 4 tibial implant, size  4 posterior stabilized femoral component, and the size 38 patella are cemented in place and the patella is held with the clamp. The trial insert is placed and the knee held in full extension. The Exparel (20 ml mixed with 30 ml saline) and .25% Bupivicaine, are injected into the extensor mechanism, posterior capsule, medial and lateral gutters and subcutaneous tissues.  All extruded cement is removed and once the cement is hard the permanent 12.5 mm posterior stabilized rotating platform insert is placed into the tibial tray.      The wound is copiously irrigated with saline solution and the extensor mechanism closed over a hemovac drain with #1 PDS suture. The tourniquet is released for a total tourniquet time of 40  minutes. Flexion against gravity is 140 degrees and the patella tracks normally. Subcutaneous tissue is closed with 2.0 vicryl and subcuticular with running 4.0 Monocryl. The incision is cleaned and dried and steri-strips and a bulky sterile dressing are applied. The limb is placed into a knee immobilizer and the patient is awakened and transported to recovery in stable condition.      Please note that a surgical assistant was a medical necessity for this procedure in order to perform it in a safe and expeditious manner. Surgical assistant was necessary to retract the ligaments and vital neurovascular structures to prevent injury to them and also necessary for proper positioning of the limb to allow for anatomic placement of the prosthesis.   Gus Rankin Karl Knarr, MD    07/13/2012, 3:25 PM

## 2012-07-13 NOTE — Anesthesia Postprocedure Evaluation (Signed)
Anesthesia Post Note  Patient: Miranda Keith  Procedure(s) Performed: Procedure(s) (LRB): LEFT TOTAL KNEE ARTHROPLASTY (Left)  Anesthesia type: General  Patient location: PACU  Post pain: Pain level controlled  Post assessment: Post-op Vital signs reviewed  Last Vitals:  Filed Vitals:   07/13/12 1715  BP: 144/84  Pulse: 69  Temp: 36.5 C  Resp: 16    Post vital signs: Reviewed  Level of consciousness: sedated  Complications: No apparent anesthesia complications

## 2012-07-13 NOTE — Interval H&P Note (Signed)
History and Physical Interval Note:  07/13/2012 1:59 PM  Miranda Keith  has presented today for surgery, with the diagnosis of osteoarthritis left knee  The various methods of treatment have been discussed with the patient and family. After consideration of risks, benefits and other options for treatment, the patient has consented to  Procedure(s): LEFT TOTAL KNEE ARTHROPLASTY (Left) as a surgical intervention .  The patient's history has been reviewed, patient examined, no change in status, stable for surgery.  I have reviewed the patient's chart and labs.  Questions were answered to the patient's satisfaction.     Loanne Drilling

## 2012-07-13 NOTE — Transfer of Care (Signed)
Immediate Anesthesia Transfer of Care Note  Patient: Miranda Keith  Procedure(s) Performed: Procedure(s) (LRB): LEFT TOTAL KNEE ARTHROPLASTY (Left)  Patient Location: PACU  Anesthesia Type: General  Level of Consciousness: sedated, patient cooperative and responds to stimulaton  Airway & Oxygen Therapy: Patient Spontanous Breathing and Patient connected to face mask oxgen  Post-op Assessment: Report given to PACU RN and Post -op Vital signs reviewed and stable  Post vital signs: Reviewed and stable  Complications: No apparent anesthesia complications

## 2012-07-13 NOTE — Anesthesia Preprocedure Evaluation (Addendum)
Anesthesia Evaluation  Patient identified by MRN, date of birth, ID band Patient awake    Reviewed: Allergy & Precautions, H&P , NPO status , Patient's Chart, lab work & pertinent test results  Airway Mallampati: II TM Distance: >3 FB Neck ROM: Full    Dental  (+) Caps, Dental Advisory Given and Teeth Intact   Pulmonary sleep apnea ,  breath sounds clear to auscultation  Pulmonary exam normal       Cardiovascular negative cardio ROS  Rhythm:Regular Rate:Normal     Neuro/Psych C-spine fusion negative neurological ROS  negative psych ROS   GI/Hepatic negative GI ROS, Neg liver ROS, Prior gastric banding   Endo/Other  Morbid obesity  Renal/GU negative Renal ROS  negative genitourinary   Musculoskeletal  (+) Fibromyalgia -  Abdominal   Peds  Hematology negative hematology ROS (+)   Anesthesia Other Findings   Reproductive/Obstetrics                           Anesthesia Physical Anesthesia Plan  ASA: III  Anesthesia Plan: General   Post-op Pain Management:    Induction: Intravenous  Airway Management Planned: Simple Face Mask  Additional Equipment:   Intra-op Plan:   Post-operative Plan:   Informed Consent: I have reviewed the patients History and Physical, chart, labs and discussed the procedure including the risks, benefits and alternatives for the proposed anesthesia with the patient or authorized representative who has indicated his/her understanding and acceptance.   Dental advisory given  Plan Discussed with: CRNA  Anesthesia Plan Comments:        Anesthesia Quick Evaluation

## 2012-07-14 ENCOUNTER — Encounter (HOSPITAL_COMMUNITY): Payer: Self-pay | Admitting: Orthopedic Surgery

## 2012-07-14 LAB — BASIC METABOLIC PANEL
CO2: 29 mEq/L (ref 19–32)
Calcium: 8.6 mg/dL (ref 8.4–10.5)
GFR calc non Af Amer: >90 mL/min (ref >90)
Potassium: 4.4 mEq/L (ref 3.5–5.1)
Sodium: 136 mEq/L (ref 135–145)

## 2012-07-14 LAB — CBC
MCH: 30.1 pg (ref 26.0–34.0)
MCHC: 33.4 g/dL (ref 30.0–36.0)
Platelets: 191 10*3/uL (ref 150–400)
RBC: 4.02 MIL/uL (ref 3.87–5.11)

## 2012-07-14 MED ORDER — TRAMADOL HCL 50 MG PO TABS: 50.0000 mg | ORAL_TABLET | Freq: Four times a day (QID) | ORAL | Status: AC | PRN

## 2012-07-14 MED ORDER — RIVAROXABAN 10 MG PO TABS: 10.0000 mg | ORAL_TABLET | Freq: Every day | ORAL | Status: DC

## 2012-07-14 MED ORDER — METHOCARBAMOL 500 MG PO TABS: 500.0000 mg | ORAL_TABLET | Freq: Four times a day (QID) | ORAL | Status: DC | PRN

## 2012-07-14 MED ORDER — OXYCODONE HCL 5 MG PO TABS: 5.0000 mg | ORAL_TABLET | ORAL | Status: DC | PRN

## 2012-07-14 NOTE — Evaluation (Signed)
Occupational Therapy Evaluation Patient Details Name: Miranda Keith MRN: [id number] DOB: November 27, 1945 Today's Date: 07/14/2012 Time: 6295-2841 OT Time Calculation (min): 27 min  OT Assessment / Plan / Recommendation Clinical Impression  Pt is s/p L TKA and is limited by 10/10 pain in L ankle, not knee pain. Informed nursing. Will benefit from skilled OT services to improve ADL independence for d/c home with family.     OT Assessment  Patient needs continued OT Services    Follow Up Recommendations  Home health OT;Supervision/Assistance - 24 hour    Barriers to Discharge      Equipment Recommendations  None recommended by OT    Recommendations for Other Services    Frequency  Min 2X/week    Precautions / Restrictions Precautions Precautions: Fall;Knee Required Braces or Orthoses: Knee Immobilizer - Left Knee Immobilizer - Left: Discontinue once straight leg raise with < 10 degree lag Restrictions Weight Bearing Restrictions: No        ADL  Eating/Feeding: Simulated;Independent Where Assessed - Eating/Feeding: Chair Grooming: Simulated;Wash/dry hands;Set up Upper Body Bathing: Simulated;Chest;Right arm;Left arm;Abdomen;Set up;Supervision/safety Where Assessed - Upper Body Bathing: Unsupported sitting Lower Body Bathing: Simulated;Maximal assistance Where Assessed - Lower Body Bathing: Supported sit to stand Upper Body Dressing: Simulated;Set up;Supervision/safety Where Assessed - Upper Body Dressing: Unsupported sitting Lower Body Dressing: Simulated;+1 Total assistance (10/10 L ankle pain and unable to let go of RW) Where Assessed - Lower Body Dressing: Supported sit to stand Toilet Transfer: Simulated;Minimal assistance;Moderate assistance Toilet Transfer Method: Sit to stand (and a few steps forward. unable to step backwards) Toileting - Architect and Hygiene: Simulated;+1 Total assistance Where Assessed - Engineer, mining and Hygiene:  Standing Equipment Used: Rolling walker ADL Comments: Pt has all Ae and would like a review of how to use. She used it with her back surgery in the past. Pt complaining of 10/10 pain in her ankle, not her knee. She states it feels like it is "sprained." Repositioned L LE on pillow and towel roll under ankle. Pt states it feels some better this way. Informed nursing of pain in ankle. She was unable to step backwards to get back in chair due to L ankle pain so pulled chair up. Limited session due to pain.     OT Diagnosis: Generalized weakness;Acute pain  OT Problem List: Decreased strength;Decreased knowledge of use of DME or AE;Pain OT Treatment Interventions: Self-care/ADL training;Therapeutic activities;DME and/or AE instruction;Patient/family education   OT Goals Acute Rehab OT Goals OT Goal Formulation: With patient Time For Goal Achievement: 07/21/12 Potential to Achieve Goals: Good ADL Goals Pt Will Perform Grooming: with supervision;Standing at sink ADL Goal: Grooming - Progress: Goal set today Pt Will Perform Lower Body Bathing: with min assist;Sit to stand from chair;Sit to stand from bed;with adaptive equipment ADL Goal: Lower Body Bathing - Progress: Goal set today Pt Will Perform Lower Body Dressing: with min assist;Sit to stand from chair;Sit to stand from bed;with adaptive equipment ADL Goal: Lower Body Dressing - Progress: Goal set today Pt Will Transfer to Toilet: with supervision;with DME;Ambulation;3-in-1 ADL Goal: Toilet Transfer - Progress: Goal set today Pt Will Perform Toileting - Clothing Manipulation: with supervision;Standing ADL Goal: Toileting - Clothing Manipulation - Progress: Goal set today Pt Will Perform Toileting - Hygiene: with supervision;Sit to stand from 3-in-1/toilet ADL Goal: Toileting - Hygiene - Progress: Goal set today  Visit Information  Last OT Received On: 07/14/12 Assistance Needed: +1    Subjective Data  Subjective: my leg is  hurting Patient Stated Goal: wants to do what she can for herself   Prior Functioning     Home Living Lives With: Daughter Available Help at Discharge: Family Type of Home: House Home Access: Stairs to enter Secretary/administrator of Steps: 1 Entrance Stairs-Rails: None Home Layout: One level Bathroom Shower/Tub: Health visitor: Handicapped height Home Adaptive Equipment: Walker - rolling;Bedside commode/3-in-1;Reacher;Sock aid;Long-handled sponge;Long-handled shoehorn Prior Function Level of Independence: Independent;Independent with assistive device(s) Able to Take Stairs?: Yes Driving: Yes Vocation: Retired Musician: No difficulties Dominant Hand: Right         Vision/Perception     Cognition  Cognition Arousal/Alertness: Awake/alert Behavior During Therapy: WFL for tasks assessed/performed Overall Cognitive Status: Within Functional Limits for tasks assessed    Extremity/Trunk Assessment Right Upper Extremity Assessment RUE ROM/Strength/Tone: WFL for tasks assessed Left Upper Extremity Assessment LUE ROM/Strength/Tone: WFL for tasks assessed Right Lower Extremity Assessment RLE ROM/Strength/Tone: WFL for tasks assessed Left Lower Extremity Assessment LLE ROM/Strength/Tone: Deficits LLE ROM/Strength/Tone Deficits: 2/5 quads with AAROM at knee - 10 - 35 Trunk Assessment Trunk Assessment: Normal     Mobility Bed Mobility Bed Mobility: Supine to Sit Supine to Sit: 4: Min assist;3: Mod assist Details for Bed Mobility Assistance: cues for sequence and use of R LE to self assist Transfers Transfers: Sit to Stand;Stand to Sit Sit to Stand: 4: Min assist;3: Mod assist;With upper extremity assist;From chair/3-in-1 Stand to Sit: 4: Min assist;3: Mod assist;With upper extremity assist;To chair/3-in-1 Details for Transfer Assistance: verbal cues for hand placement        Balance     End of Session OT - End of  Session Equipment Utilized During Treatment: Gait belt Activity Tolerance: Patient limited by pain Patient left: in chair;with call bell/phone within reach  GO     Lennox Laity [phone] 07/14/2012, 1:38 PM

## 2012-07-14 NOTE — Progress Notes (Signed)
Utilization review completed.  

## 2012-07-14 NOTE — Progress Notes (Signed)
Physical Therapy Treatment Patient Details Name: Miranda Keith MRN: [id number] DOB: [birth year] Today's Date: 07/14/2012 Time: 7829-5621 PT Time Calculation (min): 18 min  PT Assessment / Plan / Recommendation Comments on Treatment Session       Follow Up Recommendations  Home health PT     Does the patient have the potential to tolerate intense rehabilitation     Barriers to Discharge        Equipment Recommendations  None recommended by PT    Recommendations for Other Services OT consult  Frequency 7X/week   Plan Discharge plan remains appropriate    Precautions / Restrictions Precautions Precautions: Fall;Knee Precaution Comments: Pt c/o L ankle pain - states feels like previous sprains Required Braces or Orthoses: Knee Immobilizer - Left Knee Immobilizer - Left: Discontinue once straight leg raise with < 10 degree lag Restrictions Weight Bearing Restrictions: No   Pertinent Vitals/Pain 10/10 pain in L ankle, knee and thigh, pt premedicated, RN aware    Mobility  Bed Mobility Bed Mobility: Sit to Supine Sit to Supine: 4: Min assist;3: Mod assist Details for Bed Mobility Assistance: cues for sequence and use of R LE to self assist Transfers Transfers: Sit to Stand;Stand to Sit Sit to Stand: 1: +2 Total assist;With armrests;From chair/3-in-1;With upper extremity assist Sit to Stand: Patient Percentage: 70% Stand to Sit: With upper extremity assist;To bed;1: +2 Total assist;To elevated surface Stand to Sit: Patient Percentage: 70% Details for Transfer Assistance: verbal cues for hand placement and LE management Ambulation/Gait Ambulation/Gait Assistance: 1: +2 Total assist Ambulation/Gait: Patient Percentage: 70% Ambulation Distance (Feet): 5 Feet Assistive device: Rolling walker Ambulation/Gait Assistance Details: cues for posture, position from RW and sequence Gait Pattern: Step-to pattern;Decreased stride length;Decreased stance time - left;Antalgic General  Gait Details: ltd by pt c/o L ankle, knee and thigh pain Stairs: No    Exercises     PT Diagnosis:    PT Problem List:   PT Treatment Interventions:     PT Goals Acute Rehab PT Goals PT Goal Formulation: With patient Time For Goal Achievement: 07/21/12 Potential to Achieve Goals: Good Pt will go Supine/Side to Sit: with supervision PT Goal: Supine/Side to Sit - Progress: Goal set today Pt will go Sit to Supine/Side: with supervision PT Goal: Sit to Supine/Side - Progress: Goal set today Pt will go Sit to Stand: with supervision PT Goal: Sit to Stand - Progress: Goal set today Pt will go Stand to Sit: with supervision PT Goal: Stand to Sit - Progress: Goal set today Pt will Ambulate: 51 - 150 feet;with supervision;with rolling walker PT Goal: Ambulate - Progress: Goal set today Pt will Go Up / Down Stairs: 1-2 stairs;with min assist;with least restrictive assistive device PT Goal: Up/Down Stairs - Progress: Goal set today  Visit Information  Last PT Received On: 07/14/12 Assistance Needed: +2    Subjective Data  Subjective: My ankle hurts as badly as my knee and when that girl came in I could not even move Patient Stated Goal: Resume previous lifestyle with decreased pain   Cognition  Cognition Arousal/Alertness: Awake/alert Behavior During Therapy: WFL for tasks assessed/performed Overall Cognitive Status: Within Functional Limits for tasks assessed    Balance     End of Session PT - End of Session Equipment Utilized During Treatment: Gait belt;Left knee immobilizer Activity Tolerance: Patient limited by pain Patient left: in bed;with call bell/phone within reach Nurse Communication: Mobility status CPM Left Knee CPM Left Knee: On   GP  Adren Dollins 07/14/2012, 4:13 PM

## 2012-07-14 NOTE — Care Management Note (Addendum)
    Page 1 of 2   07/16/2012     4:07:58 PM   CARE MANAGEMENT NOTE 07/16/2012  Patient:  Miranda Keith, Miranda Keith   Account Number:  [id number]  Date Initiated:  07/14/2012  Documentation initiated by:  Colleen Can  Subjective/Objective Assessment:   dx osteoaethritis left knee; total knee replacemnt.     Action/Plan:   Cm spoke with patient . Plans are for her to return to [facility] where grand daughter and daughter will be her caregivers. She already has RW and commode seat. She wants [facility] services.   Anticipated DC Date:  07/17/2012   Anticipated DC Plan:  HOME W HOME HEALTH SERVICES      DC Planning Services  CM consult      [facility] Choice  HOME HEALTH   Choice offered to / List presented to:  C-1 Patient   DME arranged  BEDSIDE COMMODE        HH arranged  HH-2 PT      HH agency  [facility]   Status of service:  In process, will continue to follow Medicare Important Message given?   (If response is "NO", the following Medicare IM given date fields will be blank) Date Medicare IM given:   Date Additional Medicare IM given:    Discharge Disposition:    Per UR Regulation:    If discussed at Long Length of Stay Meetings, dates discussed:    Comments:  07/16/12 [name] RN,BSN NCM [facility] [phone] [facility] CHOSEN FOR HH,TC [facility] [phone],AWARE OF REFERRAL,& FOLLOWING.PT/OT-HH.3N1 ALREADY ORDERED-[facility] DME REP AWARE OF HT-5FT 3",WT-270 LBS FOR 3N1 ALREADY ORDERED(WILL NOT BE A BARIATRIC 3N1).AWAIT FINAL HH ORDERS.

## 2012-07-14 NOTE — Progress Notes (Signed)
OT Note Pt complaining of L ankle pain during session. States the ankle pain started last night.  Able to take a few steps with walker but pt stating pain 10/10 with weightbearing in ankle. Able to reposition in the chair with pillows and a towel roll with some relief. Informed nursing. Judithann Sauger OTR/L [phone] 07/14/2012

## 2012-07-14 NOTE — Evaluation (Signed)
Physical Therapy Evaluation Patient Details Name: Miranda Keith MRN: [id number] DOB: 1945-07-11 Today's Date: 07/14/2012 Time: 4098-1191 PT Time Calculation (min): 34 min  PT Assessment / Plan / Recommendation Clinical Impression  Pt s/p L TKR presents with decreased L LE strength/ROM and post op pain limiting functional mobility    PT Assessment  Patient needs continued PT services    Follow Up Recommendations  Home health PT    Does the patient have the potential to tolerate intense rehabilitation      Barriers to Discharge None      Equipment Recommendations  None recommended by PT    Recommendations for Other Services OT consult   Frequency 7X/week    Precautions / Restrictions Precautions Precautions: Fall;Knee Required Braces or Orthoses: Knee Immobilizer - Left Knee Immobilizer - Left: Discontinue once straight leg raise with < 10 degree lag Restrictions Weight Bearing Restrictions: No   Pertinent Vitals/Pain 10/10 with WB; 7/10 with return to sitting; Pt medicated at beginning of session, cold packs provided, Muscle relaxer provided.     Mobility  Bed Mobility Bed Mobility: Supine to Sit Supine to Sit: 4: Min assist;3: Mod assist Details for Bed Mobility Assistance: cues for sequence and use of R LE to self assist Transfers Transfers: Sit to Stand;Stand to Sit Sit to Stand: 4: Min assist;3: Mod assist Stand to Sit: 4: Min assist;3: Mod assist Details for Transfer Assistance: cues for LE management and use of UEs to self assist Ambulation/Gait Ambulation/Gait Assistance: 4: Min assist;3: Mod assist Ambulation Distance (Feet): 14 Feet Assistive device: Rolling walker Ambulation/Gait Assistance Details: cues for sequence, posture and position from RW Gait Pattern: Step-to pattern;Decreased stride length;Decreased stance time - left;Antalgic General Gait Details: ltd by pt c/o increasing pain    Exercises Total Joint Exercises Ankle Circles/Pumps: AROM;10  reps;Supine;Both Quad Sets: AROM;10 reps;Both;Supine Heel Slides: AAROM;Left;10 reps;Supine Straight Leg Raises: AAROM;Left;10 reps;Supine   PT Diagnosis: Difficulty walking  PT Problem List: Decreased strength;Decreased range of motion;Decreased activity tolerance;Decreased mobility;Decreased knowledge of use of DME;Pain;Obesity PT Treatment Interventions: DME instruction;Gait training;Stair training;Functional mobility training;Therapeutic activities;Therapeutic exercise;Patient/family education   PT Goals Acute Rehab PT Goals PT Goal Formulation: With patient Time For Goal Achievement: 07/21/12 Potential to Achieve Goals: Good Pt will go Supine/Side to Sit: with supervision PT Goal: Supine/Side to Sit - Progress: Goal set today Pt will go Sit to Supine/Side: with supervision PT Goal: Sit to Supine/Side - Progress: Goal set today Pt will go Sit to Stand: with supervision PT Goal: Sit to Stand - Progress: Goal set today Pt will go Stand to Sit: with supervision PT Goal: Stand to Sit - Progress: Goal set today Pt will Ambulate: 51 - 150 feet;with supervision;with rolling walker PT Goal: Ambulate - Progress: Goal set today Pt will Go Up / Down Stairs: 1-2 stairs;with min assist;with least restrictive assistive device PT Goal: Up/Down Stairs - Progress: Goal set today  Visit Information  Last PT Received On: 07/14/12 Assistance Needed: +1    Subjective Data  Subjective: I'm ready to try Patient Stated Goal: Resume previous lifestyle with decreaed pain   Prior Functioning  Home Living Lives With: Daughter Available Help at Discharge: Family Type of Home: House Home Access: Stairs to enter Secretary/administrator of Steps: 1 Entrance Stairs-Rails: None Home Layout: One level Home Adaptive Equipment: Walker - rolling Prior Function Level of Independence: Independent;Independent with assistive device(s) Able to Take Stairs?: Yes Driving: Yes Vocation:  Retired Musician: No difficulties Dominant Hand: Right  Cognition  Cognition Arousal/Alertness: Awake/alert Behavior During Therapy: WFL for tasks assessed/performed Overall Cognitive Status: Within Functional Limits for tasks assessed    Extremity/Trunk Assessment Right Upper Extremity Assessment RUE ROM/Strength/Tone: WFL for tasks assessed Left Upper Extremity Assessment LUE ROM/Strength/Tone: WFL for tasks assessed Right Lower Extremity Assessment RLE ROM/Strength/Tone: WFL for tasks assessed Left Lower Extremity Assessment LLE ROM/Strength/Tone: Deficits LLE ROM/Strength/Tone Deficits: 2/5 quads with AAROM at knee - 10 - 35 Trunk Assessment Trunk Assessment: Normal   Balance    End of Session PT - End of Session Equipment Utilized During Treatment: Gait belt;Left knee immobilizer Activity Tolerance: Patient limited by pain Patient left: in chair;with call bell/phone within reach Nurse Communication: Mobility status;Patient requests pain meds  GP     Louella Medaglia 07/14/2012, 12:52 PM

## 2012-07-14 NOTE — Progress Notes (Signed)
   Subjective: 1 Day Post-Op Procedure(s) (LRB): LEFT TOTAL KNEE ARTHROPLASTY (Left) Patient reports pain as mild.   We will start therapy today.  Plan is to go Home after hospital stay.  Objective: Vital signs in last 24 hours: Temp:  [97.5 F (36.4 C)-98.8 F (37.1 C)] 98 F (36.7 C) (05/22 0426) Pulse Rate:  [69-97] 97 (05/22 0426) Resp:  [10-16] 16 (05/22 0426) BP: (102-153)/(67-92) 102/67 mmHg (05/22 0426) SpO2:  [96 %-100 %] 98 % (05/22 0426) Weight:  [270 lb (122.471 kg)] 270 lb (122.471 kg) (05/21 1715)  Intake/Output from previous day:  Intake/Output Summary (Last 24 hours) at 07/14/12 0653 Last data filed at 07/14/12 0600  Gross per 24 hour  Intake   2480 ml  Output   1295 ml  Net   1185 ml    Intake/Output this shift: Total I/O In: 1380 [P.O.:360; I.V.:665; IV Piggyback:355] Out: 1050 [Urine:875; Drains:175]  Labs:  Recent Labs  07/14/12 0448  HGB 12.1    Recent Labs  07/14/12 0448  WBC 8.3  RBC 4.02  HCT 36.2  PLT 191    Recent Labs  07/14/12 0448  NA 136  K 4.4  CL 101  CO2 29  BUN 9  CREATININE 0.53  GLUCOSE 112*  CALCIUM 8.6   No results found for this basename: LABPT, INR,  in the last 72 hours  EXAM General - Patient is Alert, Appropriate and Oriented Extremity - Neurologically intact Neurovascular intact No cellulitis present Compartment soft Dressing - dressing C/D/I Motor Function - intact, moving foot and toes well on exam.  Hemovac pulled without difficulty.  Past Medical History  Diagnosis Date  . Joint pain   . Allergic rhinitis   . Arthritis   . Degenerative disc disease   . Obesity   . Fibromyalgia     Reported by pt on 08/27/09  . Complication of anesthesia     "memory problems"  . Neuropathy     hands  . Skin cancer   . Sleep apnea     "mild sleep apnea" unable to tolerate c-pap    Assessment/Plan: 1 Day Post-Op Procedure(s) (LRB): LEFT TOTAL KNEE ARTHROPLASTY (Left) Principal Problem:   OA  (osteoarthritis) of knee   Advance diet Up with therapy D/C IV fluids Plan for discharge tomorrow Discharge home with home health  DVT Prophylaxis - Xarelto Weight-Bearing as tolerated to left leg D/C O2 and Pulse OX and try on Room Air  Tyria Springer V 07/14/2012, 6:53 AM

## 2012-07-15 LAB — CBC
HCT: 33.8 % — ABNORMAL LOW (ref 36.0–46.0)
MCHC: 34.6 g/dL (ref 30.0–36.0)
Platelets: 222 10*3/uL (ref 150–400)
RDW: 12.8 % (ref 11.5–15.5)
WBC: 10.3 10*3/uL (ref 4.0–10.5)

## 2012-07-15 LAB — BASIC METABOLIC PANEL
BUN: 6 mg/dL (ref 6–23)
Chloride: 100 mEq/L (ref 96–112)
GFR calc Af Amer: >90 mL/min (ref >90)
GFR calc non Af Amer: >90 mL/min (ref >90)
Potassium: 4.5 mEq/L (ref 3.5–5.1)
Sodium: 138 mEq/L (ref 135–145)

## 2012-07-15 MED ORDER — HYDROCODONE-ACETAMINOPHEN 10-325 MG PO TABS
1.0000 | ORAL_TABLET | ORAL | Status: DC | PRN
  Administered 2012-07-15: 1 via ORAL
  Administered 2012-07-15: 2 via ORAL
  Administered 2012-07-15 (×2): 1 via ORAL
  Administered 2012-07-16 (×2): 2 via ORAL
  Administered 2012-07-17: 1 via ORAL
  Filled 2012-07-15: qty 2
  Filled 2012-07-15: qty 1
  Filled 2012-07-15: qty 2
  Filled 2012-07-15 (×3): qty 1
  Filled 2012-07-15: qty 2
  Filled 2012-07-15: qty 1
  Filled 2012-07-15: qty 2

## 2012-07-15 NOTE — Progress Notes (Signed)
Physical Therapy Treatment Patient Details Name: Miranda Keith MRN: [id number] DOB: [birth year] Today's Date: 07/15/2012 Time: 5784-6962 PT Time Calculation (min): 33 min  PT Assessment / Plan / Recommendation Comments on Treatment Session  Marked improvement vs last session with L ankle brace and decreased pain    Follow Up Recommendations  Home health PT     Does the patient have the potential to tolerate intense rehabilitation     Barriers to Discharge        Equipment Recommendations  None recommended by PT    Recommendations for Other Services OT consult  Frequency 7X/week   Plan Discharge plan remains appropriate    Precautions / Restrictions Precautions Precautions: Fall;Knee Precaution Comments: L ankle brace and shoe Required Braces or Orthoses: Knee Immobilizer - Left Knee Immobilizer - Left: Discontinue once straight leg raise with < 10 degree lag Restrictions Weight Bearing Restrictions: No   Pertinent Vitals/Pain 8/10; premed, cold packs provided    Mobility  Bed Mobility Bed Mobility: Supine to Sit Supine to Sit: 4: Min assist Details for Bed Mobility Assistance: cues for sequence and use of R LE to self assist Transfers Transfers: Sit to Stand;Stand to Sit Sit to Stand: 4: Min assist Stand to Sit: 4: Min assist Details for Transfer Assistance: verbal cues for hand placement and LE management Ambulation/Gait Ambulation/Gait Assistance: 4: Min assist Ambulation Distance (Feet): 65 Feet Assistive device: Rolling walker Ambulation/Gait Assistance Details: cues for posture, sequence and position from RW Gait Pattern: Step-to pattern;Decreased stride length;Decreased stance time - left;Antalgic    Exercises Total Joint Exercises Ankle Circles/Pumps: AROM;10 reps;Supine;Both Quad Sets: AROM;Both;Supine;15 reps Heel Slides: AAROM;Left;Supine;20 reps Straight Leg Raises: AAROM;Left;Supine;20 reps   PT Diagnosis:    PT Problem List:   PT Treatment  Interventions:     PT Goals Acute Rehab PT Goals PT Goal Formulation: With patient Time For Goal Achievement: 07/21/12 Potential to Achieve Goals: Good Pt will go Supine/Side to Sit: with supervision PT Goal: Supine/Side to Sit - Progress: Progressing toward goal Pt will go Sit to Supine/Side: with supervision PT Goal: Sit to Supine/Side - Progress: Progressing toward goal Pt will go Sit to Stand: with supervision PT Goal: Sit to Stand - Progress: Progressing toward goal Pt will go Stand to Sit: with supervision PT Goal: Stand to Sit - Progress: Progressing toward goal Pt will Ambulate: 51 - 150 feet;with supervision;with rolling walker PT Goal: Ambulate - Progress: Progressing toward goal Pt will Go Up / Down Stairs: 1-2 stairs;with min assist;with least restrictive assistive device  Visit Information  Last PT Received On: 07/15/12 Assistance Needed: +1    Subjective Data  Subjective: This is so much better with this ankle brace and shoe on. Patient Stated Goal: Resume previous lifestyle with decreased pain   Cognition  Cognition Arousal/Alertness: Awake/alert Behavior During Therapy: WFL for tasks assessed/performed Overall Cognitive Status: Within Functional Limits for tasks assessed    Balance     End of Session PT - End of Session Equipment Utilized During Treatment: Gait belt;Left knee immobilizer;Other (comment) (L ankle brace) Activity Tolerance: Patient tolerated treatment well Patient left: in chair;with call bell/phone within reach Nurse Communication: Mobility status   GP     Uzziah Rigg 07/15/2012, 1:33 PM

## 2012-07-15 NOTE — Progress Notes (Addendum)
OT Cancellation Note  Patient Details Name: EZELL MELIKIAN MRN: [id number] DOB: 04-26-1945   Cancelled Treatment:    Reason Eval/Treat Not Completed: Other (comment) (pt just finished PT and has ice on knee. wanting to rest) will try back at a later time.  Case Management: She will need a 3in1 for home. She states the 3in1 at home is her mother's and her mother does use it.  Lennox Laity [phone] 07/15/2012, 12:20 PM

## 2012-07-15 NOTE — Progress Notes (Signed)
CSW consulted for possible SNF placement. PN reviewed. PT notes much improvement since last treatment session. Pt plans to return home with HHPT. CSW is available to assist with SNF placement if plan changes and rehab is needed.  Cori Razor LCSW [phone]

## 2012-07-15 NOTE — Progress Notes (Signed)
   Subjective: 2 Days Post-Op Procedure(s) (LRB): LEFT TOTAL KNEE ARTHROPLASTY (Left) Patient reports pain as moderate and severe.   Patient seen in rounds by Dr. Lequita Halt.  She had a lot of pin last night. Her ankle below is hurting also.  She feels like it has been twisted. Patient is having problems with pain in the knee and the ankle, requiring pain medications Plan is to go home versus skilled rehab if needed after hospital stay.  Objective: Vital signs in last 24 hours: Temp:  [97.6 F (36.4 C)-99.5 F (37.5 C)] 97.8 F (36.6 C) (05/23 0647) Pulse Rate:  [83-104] 104 (05/23 0647) Resp:  [16-20] 20 (05/23 0800) BP: (101-130)/(66-80) 130/80 mmHg (05/23 0647) SpO2:  [90 %-98 %] 92 % (05/23 0800)  Intake/Output from previous day:  Intake/Output Summary (Last 24 hours) at 07/15/12 1004 Last data filed at 07/15/12 0400  Gross per 24 hour  Intake 2036.25 ml  Output   1750 ml  Net 286.25 ml    Intake/Output this shift:    Labs:  Recent Labs  07/14/12 0448 07/15/12 0510  HGB 12.1 11.7*    Recent Labs  07/14/12 0448 07/15/12 0510  WBC 8.3 10.3  RBC 4.02 3.78*  HCT 36.2 33.8*  PLT 191 222    Recent Labs  07/14/12 0448 07/15/12 0510  NA 136 138  K 4.4 4.5  CL 101 100  CO2 29 31  BUN 9 6  CREATININE 0.53 0.43*  GLUCOSE 112* 166*  CALCIUM 8.6 9.1   No results found for this basename: LABPT, INR,  in the last 72 hours  EXAM General - Patient is Alert, Appropriate and Oriented Extremity - Neurovascular intact Sensation intact distally Dorsiflexion/Plantar flexion intact Dressing/Incision - clean, dry, no drainage, healing Motor Function - intact, moving foot and toes well on exam.   Past Medical History  Diagnosis Date  . Joint pain   . Allergic rhinitis   . Arthritis   . Degenerative disc disease   . Obesity   . Fibromyalgia     Reported by pt on 08/27/09  . Complication of anesthesia     "memory problems"  . Neuropathy     hands  . Skin  cancer   . Sleep apnea     "mild sleep apnea" unable to tolerate c-pap    Assessment/Plan: 2 Days Post-Op Procedure(s) (LRB): LEFT TOTAL KNEE ARTHROPLASTY (Left) Principal Problem:   OA (osteoarthritis) of knee  Estimated body mass index is 47.84 kg/(m^2) as calculated from the following:   Height as of this encounter: 5\' 3"  (1.6 m).   Weight as of this encounter: 122.471 kg (270 lb). Up with therapy Discharge home with home health versus skilled rehab if needed  DVT Prophylaxis - Xarelto Weight-Bearing as tolerated to left leg ASO for the ankle.  Will see how she does today. Will look into SNF as a backup depending upon her progress. Possible to home or SNF tomorrow depending upon progress.  Reanna Scoggin 07/15/2012, 10:04 AM

## 2012-07-15 NOTE — Progress Notes (Signed)
Physical Therapy Treatment Patient Details Name: Miranda Keith MRN: [id number] DOB: [birth year] Today's Date: 07/15/2012 Time: 4098-1191 PT Time Calculation (min): 25 min  PT Assessment / Plan / Recommendation Comments on Treatment Session       Follow Up Recommendations  Home health PT     Does the patient have the potential to tolerate intense rehabilitation     Barriers to Discharge        Equipment Recommendations  None recommended by PT    Recommendations for Other Services OT consult  Frequency 7X/week   Plan Discharge plan remains appropriate    Precautions / Restrictions Precautions Precautions: Fall;Knee Precaution Comments: L ankle brace and shoe Required Braces or Orthoses: Knee Immobilizer - Left Knee Immobilizer - Left: Discontinue once straight leg raise with < 10 degree lag Restrictions Weight Bearing Restrictions: No   Pertinent Vitals/Pain 7/10; premedicated, ice packs provided    Mobility  Bed Mobility Bed Mobility: Sit to Supine Sit to Supine: 4: Min assist Details for Bed Mobility Assistance: cues for sequence and use of R LE to self assist Transfers Transfers: Sit to Stand;Stand to Sit Sit to Stand: 4: Min assist Stand to Sit: 4: Min assist Details for Transfer Assistance: verbal cues for hand placement and LE management Ambulation/Gait Ambulation/Gait Assistance: 4: Min assist Ambulation Distance (Feet): 168 Feet Assistive device: Rolling walker Ambulation/Gait Assistance Details: cues for posture, sequence and position from RW Gait Pattern: Step-to pattern;Decreased stride length;Decreased stance time - left;Antalgic    Exercises     PT Diagnosis:    PT Problem List:   PT Treatment Interventions:     PT Goals Acute Rehab PT Goals PT Goal Formulation: With patient Time For Goal Achievement: 07/21/12 Potential to Achieve Goals: Good Pt will go Supine/Side to Sit: with supervision PT Goal: Supine/Side to Sit - Progress: Progressing  toward goal Pt will go Sit to Supine/Side: with supervision PT Goal: Sit to Supine/Side - Progress: Progressing toward goal Pt will go Sit to Stand: with supervision PT Goal: Sit to Stand - Progress: Progressing toward goal Pt will go Stand to Sit: with supervision PT Goal: Stand to Sit - Progress: Progressing toward goal Pt will Ambulate: 51 - 150 feet;with supervision;with rolling walker PT Goal: Ambulate - Progress: Progressing toward goal Pt will Go Up / Down Stairs: 1-2 stairs;with min assist;with least restrictive assistive device  Visit Information  Last PT Received On: 07/15/12 Assistance Needed: +1    Subjective Data  Subjective: I am doing better and hoping to go home rather than rehab Patient Stated Goal: Resume previous lifestyle with decreased pain   Cognition  Cognition Arousal/Alertness: Awake/alert Behavior During Therapy: WFL for tasks assessed/performed Overall Cognitive Status: Within Functional Limits for tasks assessed    Balance     End of Session PT - End of Session Equipment Utilized During Treatment: Gait belt;Left knee immobilizer;Other (comment) Activity Tolerance: Patient tolerated treatment well Patient left: in bed;with call bell/phone within reach Nurse Communication: Mobility status CPM Left Knee CPM Left Knee: On   GP     Kourtland Coopman 07/15/2012, 5:07 PM

## 2012-07-16 ENCOUNTER — Inpatient Hospital Stay (HOSPITAL_COMMUNITY): Payer: Medicare Other

## 2012-07-16 LAB — CBC
Hemoglobin: 9.3 g/dL — ABNORMAL LOW (ref 12.0–15.0)
MCH: 29.4 pg (ref 26.0–34.0)
MCHC: 32.7 g/dL (ref 30.0–36.0)

## 2012-07-16 NOTE — Progress Notes (Signed)
   Subjective: 3 Days Post-Op Procedure(s) (LRB): LEFT TOTAL KNEE ARTHROPLASTY (Left)  Mild to moderate pain to left knee Therapy going well Mild pain in left ankle Patient reports pain as mild.  Objective:   VITALS:   Filed Vitals:   07/16/12 0542  BP: 110/63  Pulse: 101  Temp: 98.8 F (37.1 C)  Resp: 16    Left knee incision healing well nv intact distally No erythema or drainage  LABS  Recent Labs  07/14/12 0448 07/15/12 0510 07/16/12 0440  HGB 12.1 11.7* 9.3*  HCT 36.2 33.8* 28.4*  WBC 8.3 10.3 10.4  PLT 191 222 226     Recent Labs  07/14/12 0448 07/15/12 0510  NA 136 138  K 4.4 4.5  BUN 9 6  CREATININE 0.53 0.43*  GLUCOSE 112* 166*     Assessment/Plan: 3 Days Post-Op Procedure(s) (LRB): LEFT TOTAL KNEE ARTHROPLASTY (Left)  Continue PT/OT Up with therapy   Alphonsa Overall, MPAS, PA-C  07/16/2012, 7:42 AM

## 2012-07-16 NOTE — Progress Notes (Signed)
Physical Therapy Treatment Patient Details Name: Miranda Keith MRN: [id number] DOB: 07-31-1945 Today's Date: 07/16/2012 Time: 1006-1050 PT Time Calculation (min): 44 min  PT Assessment / Plan / Recommendation Comments on Treatment Session  Marked improvement vs last session with L ankle brace and decreased pain    Follow Up Recommendations  Home health PT     Does the patient have the potential to tolerate intense rehabilitation     Barriers to Discharge        Equipment Recommendations  None recommended by PT    Recommendations for Other Services OT consult  Frequency 7X/week   Plan Discharge plan remains appropriate    Precautions / Restrictions Precautions Precautions: Fall;Knee Precaution Comments: L ankle brace and shoe Required Braces or Orthoses: Knee Immobilizer - Left Knee Immobilizer - Left: Discontinue once straight leg raise with < 10 degree lag Restrictions Weight Bearing Restrictions: No   Pertinent Vitals/Pain 7/10; premed, cold packs provided    Mobility  Bed Mobility Bed Mobility: Supine to Sit Supine to Sit: 4: Min assist Details for Bed Mobility Assistance: cues for technique Transfers Transfers: Sit to Stand;Stand to Sit Sit to Stand: 4: Min assist Stand to Sit: 4: Min assist Details for Transfer Assistance: verbal cues for hand placement and LE management Ambulation/Gait Ambulation/Gait Assistance: 4: Min assist Ambulation Distance (Feet): 168 Feet Assistive device: Rolling walker Ambulation/Gait Assistance Details: cues for posture, sequence and position from RW Gait Pattern: Step-to pattern;Decreased stride length;Decreased stance time - left;Antalgic Stairs: No    Exercises Total Joint Exercises Ankle Circles/Pumps: AROM;Supine;Both;20 reps Quad Sets: AROM;Both;Supine;20 reps Heel Slides: AAROM;Left;Supine;20 reps Straight Leg Raises: AAROM;Left;Supine;20 reps Goniometric ROM: -10 - 40   PT Diagnosis:    PT Problem List:   PT  Treatment Interventions:     PT Goals Acute Rehab PT Goals PT Goal Formulation: With patient Time For Goal Achievement: 07/21/12 Potential to Achieve Goals: Good Pt will go Supine/Side to Sit: with supervision PT Goal: Supine/Side to Sit - Progress: Progressing toward goal Pt will go Sit to Supine/Side: with supervision PT Goal: Sit to Supine/Side - Progress: Progressing toward goal Pt will go Sit to Stand: with supervision PT Goal: Sit to Stand - Progress: Progressing toward goal Pt will go Stand to Sit: with supervision PT Goal: Stand to Sit - Progress: Progressing toward goal Pt will Ambulate: 51 - 150 feet;with supervision;with rolling walker PT Goal: Ambulate - Progress: Progressing toward goal Pt will Go Up / Down Stairs: 1-2 stairs;with min assist;with least restrictive assistive device  Visit Information  Last PT Received On: 07/16/12 Assistance Needed: +1    Subjective Data  Subjective: I'm leaving tomorrow Patient Stated Goal: Resume previous lifestyle with decreased pain   Cognition  Cognition Arousal/Alertness: Awake/alert Behavior During Therapy: WFL for tasks assessed/performed Overall Cognitive Status: Within Functional Limits for tasks assessed    Balance     End of Session PT - End of Session Equipment Utilized During Treatment: Gait belt;Left knee immobilizer;Other (comment) Activity Tolerance: Patient tolerated treatment well Patient left: with call bell/phone within reach;in chair Nurse Communication: Mobility status CPM Left Knee CPM Left Knee: Off   GP     Shammond Arave 07/16/2012, 1:21 PM

## 2012-07-16 NOTE — Progress Notes (Signed)
Physical Therapy Treatment Patient Details Name: Miranda Keith MRN: [id number] DOB: 09/03/1945 Today's Date: 07/16/2012 Time: 4098-1191 PT Time Calculation (min): 25 min  PT Assessment / Plan / Recommendation Comments on Treatment Session       Follow Up Recommendations  Home health PT     Does the patient have the potential to tolerate intense rehabilitation     Barriers to Discharge        Equipment Recommendations  None recommended by PT    Recommendations for Other Services OT consult  Frequency 7X/week   Plan Discharge plan remains appropriate    Precautions / Restrictions Precautions Precautions: Fall;Knee Precaution Comments: L ankle brace and shoe Required Braces or Orthoses: Knee Immobilizer - Left Knee Immobilizer - Left: Discontinue once straight leg raise with < 10 degree lag Restrictions Weight Bearing Restrictions: No   Pertinent Vitals/Pain 7/10; pt declined meds earlier 2* feeling "too sleepy"    Mobility  Bed Mobility Bed Mobility: Supine to Sit Sit to Supine: 4: Min assist Details for Bed Mobility Assistance: cues for technique, min assist for L LE Transfers Transfers: Sit to Stand;Stand to Sit Sit to Stand: 4: Min guard;With armrests;With upper extremity assist;From chair/3-in-1 Stand to Sit: 4: Min guard;To bed;With upper extremity assist Details for Transfer Assistance: verbal cues for hand placement and LE management Ambulation/Gait Ambulation/Gait Assistance: 4: Min guard Ambulation Distance (Feet): 150 Feet Assistive device: Rolling walker Ambulation/Gait Assistance Details: cues for posture and position from RW Gait Pattern: Step-to pattern;Trunk flexed;Decreased stance time - left;Antalgic Stairs: No    Exercises     PT Diagnosis:    PT Problem List:   PT Treatment Interventions:     PT Goals Acute Rehab PT Goals PT Goal Formulation: With patient Time For Goal Achievement: 07/21/12 Potential to Achieve Goals: Good Pt will go  Supine/Side to Sit: with supervision PT Goal: Supine/Side to Sit - Progress: Progressing toward goal Pt will go Sit to Supine/Side: with supervision PT Goal: Sit to Supine/Side - Progress: Progressing toward goal Pt will go Sit to Stand: with supervision PT Goal: Sit to Stand - Progress: Progressing toward goal Pt will go Stand to Sit: with supervision PT Goal: Stand to Sit - Progress: Progressing toward goal Pt will Ambulate: 51 - 150 feet;with supervision;with rolling walker PT Goal: Ambulate - Progress: Progressing toward goal Pt will Go Up / Down Stairs: 1-2 stairs;with min assist;with least restrictive assistive device  Visit Information  Last PT Received On: 07/16/12 Assistance Needed: +1    Subjective Data  Subjective: My ankle is bothering me again Patient Stated Goal: Resume previous lifestyle with decreased pain   Cognition  Cognition Arousal/Alertness: Awake/alert Behavior During Therapy: WFL for tasks assessed/performed Overall Cognitive Status: Within Functional Limits for tasks assessed    Balance     End of Session PT - End of Session Equipment Utilized During Treatment: Gait belt;Left knee immobilizer;Other (comment) Activity Tolerance: Patient tolerated treatment well;Patient limited by fatigue Patient left: with call bell/phone within reach;in chair Nurse Communication: Mobility status CPM Left Knee CPM Left Knee: On   GP     Miranda Keith 07/16/2012, 3:38 PM

## 2012-07-16 NOTE — Progress Notes (Signed)
Occupational Therapy Treatment Patient Details Name: Miranda Keith MRN: [id number] DOB: [birth year] Today's Date: 07/16/2012 Time: 7829-5621 OT Time Calculation (min): 31 min  OT Assessment / Plan / Recommendation Comments on Treatment Session pt had pain this am but wanted to get washed up.  Performed adl at eob and rolled for buttocks due to pain/fatique.  Pt has AE but still need some assistance until LLE can clear floor.      Follow Up Recommendations  Home health OT;Supervision/Assistance - 24 hour    Barriers to Discharge       Equipment Recommendations  3 in 1 bedside comode    Recommendations for Other Services    Frequency Min 2X/week   Plan Discharge plan remains appropriate    Precautions / Restrictions Precautions Precautions: Fall;Knee Precaution Comments: L ankle brace and shoe Required Braces or Orthoses: Knee Immobilizer - Left Knee Immobilizer - Left: Discontinue once straight leg raise with < 10 degree lag Restrictions Weight Bearing Restrictions: No   Pertinent Vitals/Pain Ankle 5/10.  Premedicated    ADL  Lower Body Bathing: Moderate assistance (with AE (simulated with long sponge)) Where Assessed - Lower Body Bathing: Unsupported sitting;Rolling right and/or left Lower Body Dressing: Maximal assistance Where Assessed - Lower Body Dressing: Unsupported sitting (used sock aid on RLE) Transfers/Ambulation Related to ADLs: only sat eob for adl this am.  Still had pain, premedicated but wanted to get washed up ADL Comments: performed adl but wanted to lie down for peri care.  Educated on AE and practiced with all using RLE.  Pt is unable to lift LLE to clear floor and was unable to assist with RLE.  Will have help initially.  Educated pt on toilet aid as she has difficulty reaching.      OT Diagnosis:    OT Problem List:   OT Treatment Interventions:     OT Goals ADL Goals Pt Will Perform Lower Body Bathing: with min assist;Sit to stand from chair;Sit to  stand from bed;with adaptive equipment ADL Goal: Lower Body Bathing - Progress: Progressing toward goals Pt Will Perform Lower Body Dressing: with min assist;Sit to stand from chair;Sit to stand from bed;with adaptive equipment ADL Goal: Lower Body Dressing - Progress: Progressing toward goals  Visit Information  Last OT Received On: 07/16/12 Assistance Needed: +1    Subjective Data      Prior Functioning       Cognition  Cognition Arousal/Alertness: Awake/alert Behavior During Therapy: WFL for tasks assessed/performed Overall Cognitive Status: Within Functional Limits for tasks assessed    Mobility  Bed Mobility Supine to Sit: 4: Min assist;With rails;HOB elevated Details for Bed Mobility Assistance: cues for technique    Exercises      Balance     End of Session OT - End of Session Activity Tolerance: Patient limited by pain;Patient limited by fatigue Patient left: in bed;with call bell/phone within reach  GO     [facility] 07/16/2012, 10:55 AM Marica Otter, OTR/L [phone] 07/16/2012

## 2012-07-17 MED ORDER — HYDROCODONE-ACETAMINOPHEN 10-325 MG PO TABS: 1.0000 | ORAL_TABLET | ORAL | Status: DC | PRN

## 2012-07-17 NOTE — Progress Notes (Signed)
   Subjective: 4 Days Post-Op Procedure(s) (LRB): LEFT TOTAL KNEE ARTHROPLASTY (Left) Patient reports pain as mild.    Plan is to go Home after hospital stay.  Objective: Vital signs in last 24 hours: Temp:  [98.5 F (36.9 C)-100 F (37.8 C)] 98.5 F (36.9 C) (05/25 0500) Pulse Rate:  [98-123] 98 (05/25 0500) Resp:  [17-20] 18 (05/25 0500) BP: (98-155)/(59-79) 128/79 mmHg (05/25 0500) SpO2:  [90 %-99 %] 98 % (05/25 0500)  Intake/Output from previous day:  Intake/Output Summary (Last 24 hours) at 07/17/12 0733 Last data filed at 07/17/12 0500  Gross per 24 hour  Intake    660 ml  Output   1400 ml  Net   -740 ml    Intake/Output this shift:    Labs:  Recent Labs  07/15/12 0510 07/16/12 0440  HGB 11.7* 9.3*    Recent Labs  07/15/12 0510 07/16/12 0440  WBC 10.3 10.4  RBC 3.78* 3.16*  HCT 33.8* 28.4*  PLT 222 226    Recent Labs  07/15/12 0510  NA 138  K 4.5  CL 100  CO2 31  BUN 6  CREATININE 0.43*  GLUCOSE 166*  CALCIUM 9.1   No results found for this basename: LABPT, INR,  in the last 72 hours  EXAM General - Patient is Alert, Appropriate and Oriented Extremity - Neurologically intact Neurovascular intact Incision: dressing C/D/I No cellulitis present Compartment soft ankle slightly tender laterally. No significant swelling Dressing/Incision - clean, dry, no drainage Motor Function - intact, moving foot and toes well on exam.   Past Medical History  Diagnosis Date  . Joint pain   . Allergic rhinitis   . Arthritis   . Degenerative disc disease   . Obesity   . Fibromyalgia     Reported by pt on 08/27/09  . Complication of anesthesia     "memory problems"  . Neuropathy     hands  . Skin cancer   . Sleep apnea     "mild sleep apnea" unable to tolerate c-pap    Assessment/Plan: 4 Days Post-Op Procedure(s) (LRB): LEFT TOTAL KNEE ARTHROPLASTY (Left) Principal Problem:   OA (osteoarthritis) of knee   Discharge home with home  health  DVT Prophylaxis - Xarelto Weight-Bearing as tolerated to left leg  Miranda Keith V 07/17/2012, 7:33 AM

## 2012-07-17 NOTE — Progress Notes (Signed)
Occupational Therapy Treatment Patient Details Name: ALEIGH GRUNDEN MRN: [id number] DOB: Oct 08, 1945 Today's Date: 07/17/2012 Time: 5621-3086 OT Time Calculation (min): 34 min  OT Assessment / Plan / Recommendation Comments on Treatment Session Pt participated well but fatiques easily.  Plans to discharge today with granddaughter's help.  Needs wide 3:1    Follow Up Recommendations  Home health OT;Supervision/Assistance - 24 hour    Barriers to Discharge       Equipment Recommendations  3 in 1 bedside comode (wide)    Recommendations for Other Services    Frequency Min 2X/week   Plan      Precautions / Restrictions Precautions Precautions: Fall;Knee Precaution Comments: L ankle brace and shoe Required Braces or Orthoses: Knee Immobilizer - Left Knee Immobilizer - Left: Discontinue once straight leg raise with < 10 degree lag Restrictions Weight Bearing Restrictions: No   Pertinent Vitals/Pain 9/10 R knee--premedicated, repositioned and ice applied.  Requested pain meds for headache--not bad but bothersome    ADL  Grooming: Performed;Teeth care;Supervision/safety Where Assessed - Grooming: Supported standing Toilet Transfer: Performed;Min Pension scheme manager Method: Sit to Barista: Extra wide bedside commode;Raised toilet seat with arms (or 3-in-1 over toilet) Toileting - Clothing Manipulation and Hygiene: Performed;+1 Total assistance Where Assessed - Toileting Clothing Manipulation and Hygiene: Standing Transfers/Ambulation Related to ADLs: ambulated to bathroom with min A.   ADL Comments: Pt fatiques easily.  Will have daughter assist at home.  Clarified with pt:  3:1 at home is her mother's, and her mother uses it.  Requested wide 3:1, if possible.  Spoke to RN who will order it and CM.  Pt plans home today    OT Diagnosis:    OT Problem List:   OT Treatment Interventions:     OT Goals ADL Goals Pt Will Perform Grooming: with  supervision;Standing at sink ADL Goal: Grooming - Progress: Met Pt Will Transfer to Toilet: with supervision;with DME;Ambulation;3-in-1 ADL Goal: Toilet Transfer - Progress: Progressing toward goals Pt Will Perform Toileting - Hygiene: with supervision;Sit to stand from 3-in-1/toilet ADL Goal: Toileting - Hygiene - Progress:  (will need toilet aide)  Visit Information  Last OT Received On: 07/17/12 Assistance Needed: +1    Subjective Data      Prior Functioning       Cognition  Cognition Arousal/Alertness: Awake/alert Behavior During Therapy: WFL for tasks assessed/performed Overall Cognitive Status: Within Functional Limits for tasks assessed    Mobility  Bed Mobility Supine to Sit: 4: Min assist;HOB flat;With rails Details for Bed Mobility Assistance: min A for leg Transfers Sit to Stand: 4: Min guard;With armrests;With upper extremity assist;From chair/3-in-1 Details for Transfer Assistance: cues for hand placement    Exercises      Balance     End of Session OT - End of Session Activity Tolerance: Patient limited by pain;Patient limited by fatigue Patient left: in chair;with call bell/phone within reach Nurse Communication: Patient requests pain meds (for headache) CPM Left Knee CPM Left Knee: Off  GO     Cecil Bixby 07/17/2012, 10:34 AM Marica Otter, OTR/L [phone] 07/17/2012

## 2012-07-17 NOTE — Progress Notes (Signed)
Physical Therapy Treatment Patient Details Name: Miranda Keith MRN: [id number] DOB: 1945-07-30 Today's Date: 07/17/2012 Time: 4098-1191 PT Time Calculation (min): 26 min  PT Assessment / Plan / Recommendation Comments on Treatment Session  doing well today; reviewed donning and doffing of  KI    Follow Up Recommendations  Home health PT     Does the patient have the potential to tolerate intense rehabilitation     Barriers to Discharge        Equipment Recommendations  None recommended by PT    Recommendations for Other Services    Frequency     Plan Discharge plan remains appropriate    Precautions / Restrictions Precautions Precautions: Fall;Knee Precaution Comments: L ankle brace and shoe Required Braces or Orthoses: Knee Immobilizer - Left Knee Immobilizer - Left: Discontinue once straight leg raise with < 10 degree lag Restrictions Weight Bearing Restrictions: No   Pertinent Vitals/Pain Pain is "fair"    Mobility  Bed Mobility Bed Mobility: Not assessed Supine to Sit: 4: Min assist;HOB flat;With rails Details for Bed Mobility Assistance: min A for leg Transfers Transfers: Sit to Stand;Stand to Sit Sit to Stand: 4: Min guard;5: Supervision;With armrests;From chair/3-in-1 Stand to Sit: 5: Supervision;4: Min guard;To chair/3-in-1;With upper extremity assist;With armrests Details for Transfer Assistance: cues for hand placement Ambulation/Gait Ambulation/Gait Assistance: 4: Min guard;5: Supervision Ambulation Distance (Feet): 90 Feet Assistive device: Rolling walker Ambulation/Gait Assistance Details: cues for posture and position from RW Gait Pattern: Step-to pattern    Exercises Total Joint Exercises Ankle Circles/Pumps: AROM;Supine;Both;20 reps Quad Sets: AROM;10 reps Short Arc QuadBarbaraann Keith;Left;10 reps Heel Slides: Left;10 reps;AAROM Hip ABduction/ADduction: AAROM;Left;10 reps Straight Leg Raises: AAROM;Left;10 reps Goniometric ROM: 10 to 45 flexion    PT Diagnosis:    PT Problem List:   PT Treatment Interventions:     PT Goals Acute Rehab PT Goals Time For Goal Achievement: 07/21/12 Potential to Achieve Goals: Good Pt will go Sit to Stand: with supervision PT Goal: Sit to Stand - Progress: Met Pt will go Stand to Sit: with supervision PT Goal: Stand to Sit - Progress: Met Pt will Ambulate: 51 - 150 feet;with supervision;with rolling walker PT Goal: Ambulate - Progress: Met  Visit Information  Last PT Received On: 07/17/12 Assistance Needed: +1    Subjective Data  Subjective: my ankle is better Patient Stated Goal: Resume previous lifestyle with decreased pain   Cognition  Cognition Arousal/Alertness: Awake/alert Behavior During Therapy: WFL for tasks assessed/performed Overall Cognitive Status: Within Functional Limits for tasks assessed    Balance     End of Session PT - End of Session Equipment Utilized During Treatment: Gait belt;Left knee immobilizer;Other (comment) Activity Tolerance: Patient tolerated treatment well;Patient limited by fatigue Patient left: with call bell/phone within reach;in chair CPM Left Knee CPM Left Knee: Off   GP     [facility] 07/17/2012, 10:43 AM

## 2012-07-28 NOTE — Discharge Summary (Signed)
Physician Discharge Summary   Patient ID: Miranda Keith MRN: [id number] DOB/AGE: 67-Aug-1947 67 y.o.  Admit date: 07/13/2012 Discharge date: 07/17/2012  Primary Diagnosis: Osteoarthritis Left knee   Admission Diagnoses:  Past Medical History  Diagnosis Date  . Joint pain   . Allergic rhinitis   . Arthritis   . Degenerative disc disease   . Obesity   . Fibromyalgia     Reported by pt on 08/27/09  . Complication of anesthesia     "memory problems"  . Neuropathy     hands  . Skin cancer   . Sleep apnea     "mild sleep apnea" unable to tolerate c-pap   Discharge Diagnoses:   Principal Problem:   OA (osteoarthritis) of knee  Estimated body mass index is 47.84 kg/(m^2) as calculated from the following:   Height as of this encounter: 5\' 3"  (1.6 m).   Weight as of this encounter: 122.471 kg (270 lb).  Procedure:  Procedure(s) (LRB): LEFT TOTAL KNEE ARTHROPLASTY (Left)   Consults: None  HPI: Miranda Keith is a 67 y.o. year old female with end stage OA of her left knee with progressively worsening pain and dysfunction. She has constant pain, with activity and at rest and significant functional deficits with difficulties even with ADLs. She has had extensive non-op management including analgesics, injections of cortisone and viscosupplements, and home exercise program, but remains in significant pain with significant dysfunction. Radiographs show bone on bone arthritis medial and patellofemoral. She presents now for left Total Knee Arthroplasty.   Laboratory Data: Admission on 07/13/2012, Discharged on 07/17/2012  Component Date Value Range Status  . WBC 07/14/2012 8.3  4.0 - 10.5 K/uL Final  . RBC 07/14/2012 4.02  3.87 - 5.11 MIL/uL Final  . Hemoglobin 07/14/2012 12.1  12.0 - 15.0 g/dL Final  . HCT 13/09/6576 36.2  36.0 - 46.0 % Final  . MCV 07/14/2012 90.0  78.0 - 100.0 fL Final  . MCH 07/14/2012 30.1  26.0 - 34.0 pg Final  . MCHC 07/14/2012 33.4  30.0 - 36.0 g/dL Final  . RDW  46/96/2952 13.1  11.5 - 15.5 % Final  . Platelets 07/14/2012 191  150 - 400 K/uL Final  . Sodium 07/14/2012 136  135 - 145 mEq/L Final  . Potassium 07/14/2012 4.4  3.5 - 5.1 mEq/L Final  . Chloride 07/14/2012 101  96 - 112 mEq/L Final  . CO2 07/14/2012 29  19 - 32 mEq/L Final  . Glucose, Bld 07/14/2012 112* 70 - 99 mg/dL Final  . BUN 84/13/2440 9  6 - 23 mg/dL Final  . Creatinine, Ser 07/14/2012 0.53  0.50 - 1.10 mg/dL Final  . Calcium 12/20/2534 8.6  8.4 - 10.5 mg/dL Final  . GFR calc non Af Amer 07/14/2012 >90  >90 mL/min Final  . GFR calc Af Amer 07/14/2012 >90  >90 mL/min Final   Comment:                                 The eGFR has been calculated                          using the CKD EPI equation.                          This calculation has not been  validated in all clinical                          situations.                          eGFR's persistently                          <90 mL/min signify                          possible Chronic Kidney Disease.  . WBC 07/15/2012 10.3  4.0 - 10.5 K/uL Final  . RBC 07/15/2012 3.78* 3.87 - 5.11 MIL/uL Final  . Hemoglobin 07/15/2012 11.7* 12.0 - 15.0 g/dL Final  . HCT 96/05/5407 33.8* 36.0 - 46.0 % Final  . MCV 07/15/2012 89.4  78.0 - 100.0 fL Final  . MCH 07/15/2012 31.0  26.0 - 34.0 pg Final  . MCHC 07/15/2012 34.6  30.0 - 36.0 g/dL Final  . RDW 81/19/1478 12.8  11.5 - 15.5 % Final  . Platelets 07/15/2012 222  150 - 400 K/uL Final  . Sodium 07/15/2012 138  135 - 145 mEq/L Final  . Potassium 07/15/2012 4.5  3.5 - 5.1 mEq/L Final  . Chloride 07/15/2012 100  96 - 112 mEq/L Final  . CO2 07/15/2012 31  19 - 32 mEq/L Final  . Glucose, Bld 07/15/2012 166* 70 - 99 mg/dL Final  . BUN 29/56/2130 6  6 - 23 mg/dL Final  . Creatinine, Ser 07/15/2012 0.43* 0.50 - 1.10 mg/dL Final  . Calcium 86/57/8469 9.1  8.4 - 10.5 mg/dL Final  . GFR calc non Af Amer 07/15/2012 >90  >90 mL/min Final  . GFR calc Af Amer 07/15/2012  >90  >90 mL/min Final   Comment:                                 The eGFR has been calculated                          using the CKD EPI equation.                          This calculation has not been                          validated in all clinical                          situations.                          eGFR's persistently                          <90 mL/min signify                          possible Chronic Kidney Disease.  . WBC 07/16/2012 10.4  4.0 - 10.5 K/uL Final  . RBC 07/16/2012 3.16* 3.87 - 5.11 MIL/uL Final  . Hemoglobin 07/16/2012 9.3* 12.0 - 15.0 g/dL Final   Comment: DELTA CHECK NOTED  REPEATED TO VERIFY  . HCT 07/16/2012 28.4* 36.0 - 46.0 % Final  . MCV 07/16/2012 89.9  78.0 - 100.0 fL Final  . MCH 07/16/2012 29.4  26.0 - 34.0 pg Final  . MCHC 07/16/2012 32.7  30.0 - 36.0 g/dL Final  . RDW 95/62/1308 13.1  11.5 - 15.5 % Final  . Platelets 07/16/2012 226  150 - 400 K/uL Final  Hospital Outpatient Visit on 07/06/2012  Component Date Value Range Status  . aPTT 07/06/2012 30  24 - 37 seconds Final  . WBC 07/06/2012 6.7  4.0 - 10.5 K/uL Final  . RBC 07/06/2012 4.91  3.87 - 5.11 MIL/uL Final  . Hemoglobin 07/06/2012 15.3* 12.0 - 15.0 g/dL Final  . HCT 65/78/4696 43.7  36.0 - 46.0 % Final  . MCV 07/06/2012 89.0  78.0 - 100.0 fL Final  . MCH 07/06/2012 31.2  26.0 - 34.0 pg Final  . MCHC 07/06/2012 35.0  30.0 - 36.0 g/dL Final  . RDW 29/52/8413 12.7  11.5 - 15.5 % Final  . Platelets 07/06/2012 245  150 - 400 K/uL Final  . Sodium 07/06/2012 137  135 - 145 mEq/L Final  . Potassium 07/06/2012 4.0  3.5 - 5.1 mEq/L Final  . Chloride 07/06/2012 99  96 - 112 mEq/L Final  . CO2 07/06/2012 29  19 - 32 mEq/L Final  . Glucose, Bld 07/06/2012 82  70 - 99 mg/dL Final  . BUN 24/40/1027 14  6 - 23 mg/dL Final  . Creatinine, Ser 07/06/2012 0.50  0.50 - 1.10 mg/dL Final  . Calcium 25/36/6440 9.3  8.4 - 10.5 mg/dL Final  . Total Protein 07/06/2012 6.8  6.0  - 8.3 g/dL Final  . Albumin 34/74/2595 3.8  3.5 - 5.2 g/dL Final  . AST 63/87/5643 21  0 - 37 U/L Final  . ALT 07/06/2012 20  0 - 35 U/L Final  . Alkaline Phosphatase 07/06/2012 76  39 - 117 U/L Final  . Total Bilirubin 07/06/2012 0.6  0.3 - 1.2 mg/dL Final  . GFR calc non Af Amer 07/06/2012 >90  >90 mL/min Final  . GFR calc Af Amer 07/06/2012 >90  >90 mL/min Final   Comment:                                 The eGFR has been calculated                          using the CKD EPI equation.                          This calculation has not been                          validated in all clinical                          situations.                          eGFR's persistently                          <90 mL/min signify  possible Chronic Kidney Disease.  Marland Kitchen Prothrombin Time 07/06/2012 13.0  11.6 - 15.2 seconds Final  . INR 07/06/2012 0.99  0.00 - 1.49 Final  . ABO/RH(D) 07/06/2012 O POS   Final  . Antibody Screen 07/06/2012 NEG   Final  . Sample Expiration 07/06/2012 07/16/2012   Final  . Color, Urine 07/06/2012 YELLOW  YELLOW Final  . APPearance 07/06/2012 CLEAR  CLEAR Final  . Specific Gravity, Urine 07/06/2012 1.029  1.005 - 1.030 Final  . pH 07/06/2012 6.5  5.0 - 8.0 Final  . Glucose, UA 07/06/2012 NEGATIVE  NEGATIVE mg/dL Final  . Hgb urine dipstick 07/06/2012 NEGATIVE  NEGATIVE Final  . Bilirubin Urine 07/06/2012 NEGATIVE  NEGATIVE Final  . Ketones, ur 07/06/2012 NEGATIVE  NEGATIVE mg/dL Final  . Protein, ur 40/98/1191 NEGATIVE  NEGATIVE mg/dL Final  . Urobilinogen, UA 07/06/2012 0.2  0.0 - 1.0 mg/dL Final  . Nitrite 47/82/9562 NEGATIVE  NEGATIVE Final  . Leukocytes, UA 07/06/2012 NEGATIVE  NEGATIVE Final   MICROSCOPIC NOT DONE ON URINES WITH NEGATIVE PROTEIN, BLOOD, LEUKOCYTES, NITRITE, OR GLUCOSE <1000 mg/dL.  Marland Kitchen MRSA, PCR 07/06/2012 NEGATIVE  NEGATIVE Final  . Staphylococcus aureus 07/06/2012 NEGATIVE  NEGATIVE Final   Comment:                                  The Xpert SA Assay (FDA                          approved for NASAL specimens                          in patients over 93 years of age),                          is one component of                          a comprehensive surveillance                          program.  Test performance has                          been validated by [employer] for patients greater                          than or equal to 59 year old.                          It is not intended                          to diagnose infection nor to                          guide or monitor treatment.     X-Rays:Dg Ankle Complete Left  07/16/2012   *RADIOLOGY REPORT*  Clinical Data: Anterior/lateral ankle pain since recent total knee arthroplasty  LEFT ANKLE  COMPLETE - 3+ VIEW  Comparison: None.  Findings: No fracture or dislocation is seen.  The ankle mortise is intact.  The base of the fifth metatarsal is unremarkable.  Mild degenerative changes of the tibiotalar joint and dorsal midfoot.  Subcutaneous calcifications along the posterior aspect of the distal tibia.  Possible mild diffuse soft tissue swelling.  IMPRESSION: No fracture or dislocation is seen.  Degenerative changes.  Mild soft tissue swelling.   Original Report Authenticated By: Charline Bills, M.D.    EKG: Orders placed during the hospital encounter of 07/13/12  . EKG 12-LEAD  . EKG 12-LEAD  . EKG     Hospital Course: SHANAIYA BENE is a 67 y.o. who was admitted to [facility]. They were brought to the operating room on 07/13/2012 and underwent Procedure(s): LEFT TOTAL KNEE ARTHROPLASTY.  Patient tolerated the procedure well and was later transferred to the recovery room and then to the orthopaedic floor for postoperative care.  They were given PO and IV analgesics for pain control following their surgery.  They were given 24 hours of postoperative antibiotics of  Anti-infectives   Start     Dose/Rate Route Frequency  Ordered Stop   07/13/12 2030  ceFAZolin (ANCEF) IVPB 2 g/50 mL premix     2 g 100 mL/hr over 30 Minutes Intravenous Every 6 hours 07/13/12 1715 07/14/12 0157   07/13/12 0923  ceFAZolin (ANCEF) 3 g in dextrose 5 % 50 mL IVPB     3 g 160 mL/hr over 30 Minutes Intravenous On call to O.R. 07/13/12 1610 07/13/12 1423     and started on DVT prophylaxis in the form of Xarelto.   PT and OT were ordered for total joint protocol.  Discharge planning consulted to help with postop disposition and equipment needs.  Patient had a tough night on the evening of surgery.  They started to get up OOB with therapy on day one and walked over 10 feet. Hemovac drain was pulled without difficulty.  On POD 2, patient was seen in rounds by Dr. Lequita Halt. She had a lot of pain the night before. Her ankle below was hurting also. She felt like it has been twisted.  Adde an ASO. Continued to work with therapy into day two walking over 60 feet.  Dressing was changed on day two and the incision was healing well.  By day three, the patient had progressed with therapy but still needed more.  Incision was healing well.  Patient was kept one more day and then seen in rounds on POD 4 and was ready to go home.   Discharge Medications: Prior to Admission medications   Medication Sig Start Date End Date Taking? Authorizing Provider  ALPRAZolam Prudy Feeler) 0.25 MG tablet Take 0.25 mg by mouth daily as needed for anxiety.  02/18/12  Yes Historical Provider, MD  cetirizine (ZYRTEC) 10 MG tablet Take 10 mg by mouth daily.    Historical Provider, MD  furosemide (LASIX) 20 MG tablet Take 20 mg by mouth daily as needed (for fluid).  04/28/11   Historical Provider, MD  HYDROcodone-acetaminophen (NORCO) 10-325 MG per tablet Take 1-2 tablets by mouth every 4 (four) hours as needed. 07/17/12   Loanne Drilling, MD  Magnesium 400 MG CAPS Take 400 mg by mouth 3 (three) times daily.    Historical Provider, MD  methocarbamol (ROBAXIN) 500 MG tablet Take 1  tablet (500 mg total) by mouth every 6 (six) hours as needed. 07/14/12   Alexzandrew Julien Girt,  PA-C  oxyCODONE (OXY IR/ROXICODONE) 5 MG immediate release tablet Take 1-2 tablets (5-10 mg total) by mouth every 3 (three) hours as needed. 07/14/12   Alexzandrew Perkins, PA-C  potassium chloride (K-DUR) 10 MEQ tablet Take 10 mEq by mouth daily as needed. Only takes when taking lasix 04/28/11   Historical Provider, MD  rivaroxaban (XARELTO) 10 MG TABS tablet Take 1 tablet (10 mg total) by mouth daily with breakfast. Take Xarelto for two and a half more weeks, then discontinue Xarelto. 07/14/12   Alexzandrew Perkins, PA-C  traMADol (ULTRAM) 50 MG tablet Take 1-2 tablets (50-100 mg total) by mouth every 6 (six) hours as needed (mild pain). 07/14/12   Alexzandrew Julien Girt, PA-C    Diet: Regular diet Activity:WBAT Follow-up:in 2 weeks Disposition - Home Discharged Condition: improving   Discharge Orders   Future Orders Complete By Expires     Call MD / Call 911  As directed     Comments:      If you experience chest pain or shortness of breath, CALL 911 and be transported to the hospital emergency room.  If you develope a fever above 101 F, pus (Delpizzo drainage) or increased drainage or redness at the wound, or calf pain, call your surgeon's office.    Change dressing  As directed     Comments:      Change dressing daily with sterile 4 x 4 inch gauze dressing and apply TED hose. Do not submerge the incision under water.    Constipation Prevention  As directed     Comments:      Drink plenty of fluids.  Prune juice may be helpful.  You may use a stool softener, such as Colace (over the counter) 100 mg twice a day.  Use MiraLax (over the counter) for constipation as needed.    Diet - low sodium heart healthy  As directed     Discharge instructions  As directed     Comments:      Pick up stool softner and laxative for home. Do not submerge incision under water. May shower. Continue to use ice for pain and  swelling from surgery.  Take Xarelto for two and a half more weeks, then discontinue Xarelto.    Do not put a pillow under the knee. Place it under the heel.  As directed     Do not sit on low chairs, stoools or toilet seats, as it may be difficult to get up from low surfaces  As directed     Driving restrictions  As directed     Comments:      No driving until released by the physician.    Increase activity slowly as tolerated  As directed     Lifting restrictions  As directed     Comments:      No lifting until released by the physician.    Patient may shower  As directed     Comments:      You may shower without a dressing once there is no drainage.  Do not wash over the wound.  If drainage remains, do not shower until drainage stops.    TED hose  As directed     Comments:      Use stockings (TED hose) for 3 weeks on both leg(s).  You may remove them at night for sleeping.    Weight bearing as tolerated  As directed         Medication List    STOP  taking these medications       calcium-vitamin D 500-200 MG-UNIT per tablet  Commonly known as:  OSCAL WITH D     cholecalciferol 1000 UNITS tablet  Commonly known as:  VITAMIN D     fish oil-omega-3 fatty acids 1000 MG capsule     vitamin C 500 MG tablet  Commonly known as:  ASCORBIC ACID      TAKE these medications       ALPRAZolam 0.25 MG tablet  Commonly known as:  XANAX  Take 0.25 mg by mouth daily as needed for anxiety.     cetirizine 10 MG tablet  Commonly known as:  ZYRTEC  Take 10 mg by mouth daily.     furosemide 20 MG tablet  Commonly known as:  LASIX  Take 20 mg by mouth daily as needed (for fluid).     HYDROcodone-acetaminophen 10-325 MG per tablet  Commonly known as:  NORCO  Take 1-2 tablets by mouth every 4 (four) hours as needed.     Magnesium 400 MG Caps  Take 400 mg by mouth 3 (three) times daily.     methocarbamol 500 MG tablet  Commonly known as:  ROBAXIN  Take 1 tablet (500 mg total) by  mouth every 6 (six) hours as needed.     oxyCODONE 5 MG immediate release tablet  Commonly known as:  Oxy IR/ROXICODONE  Take 1-2 tablets (5-10 mg total) by mouth every 3 (three) hours as needed.     potassium chloride 10 MEQ tablet  Commonly known as:  K-DUR  Take 10 mEq by mouth daily as needed. Only takes when taking lasix     rivaroxaban 10 MG Tabs tablet  Commonly known as:  XARELTO  Take 1 tablet (10 mg total) by mouth daily with breakfast. Take Xarelto for two and a half more weeks, then discontinue Xarelto.     traMADol 50 MG tablet  Commonly known as:  ULTRAM  Take 1-2 tablets (50-100 mg total) by mouth every 6 (six) hours as needed (mild pain).           Follow-up Information   Follow up with Loanne Drilling, MD. Schedule an appointment as soon as possible for a visit on 07/28/2012. (Call [phone] to make the appointment)    Contact information:   [address] [place] [address] [phone]       Signed: Patrica Duel 07/28/2012, 9:44 AM

## 2012-08-08 ENCOUNTER — Ambulatory Visit: Payer: Medicare Other | Attending: Orthopedic Surgery

## 2012-08-08 DIAGNOSIS — M25669 Stiffness of unspecified knee, not elsewhere classified: Secondary | ICD-10-CM | POA: Insufficient documentation

## 2012-08-08 DIAGNOSIS — Z96659 Presence of unspecified artificial knee joint: Secondary | ICD-10-CM | POA: Insufficient documentation

## 2012-08-08 DIAGNOSIS — M25569 Pain in unspecified knee: Secondary | ICD-10-CM | POA: Insufficient documentation

## 2012-08-08 DIAGNOSIS — IMO0001 Reserved for inherently not codable concepts without codable children: Secondary | ICD-10-CM | POA: Insufficient documentation

## 2012-08-15 ENCOUNTER — Ambulatory Visit: Payer: Medicare Other | Admitting: Physical Therapy

## 2012-08-17 ENCOUNTER — Ambulatory Visit: Payer: Medicare Other | Admitting: Physical Therapy

## 2012-08-19 ENCOUNTER — Ambulatory Visit: Payer: Medicare Other | Admitting: Physical Therapy

## 2012-08-22 ENCOUNTER — Ambulatory Visit: Payer: Medicare Other | Admitting: Physical Therapy

## 2012-08-25 ENCOUNTER — Ambulatory Visit: Payer: Medicare Other | Attending: Orthopedic Surgery | Admitting: Physical Therapy

## 2012-08-25 DIAGNOSIS — M25669 Stiffness of unspecified knee, not elsewhere classified: Secondary | ICD-10-CM | POA: Insufficient documentation

## 2012-08-25 DIAGNOSIS — IMO0001 Reserved for inherently not codable concepts without codable children: Secondary | ICD-10-CM | POA: Insufficient documentation

## 2012-08-25 DIAGNOSIS — Z96659 Presence of unspecified artificial knee joint: Secondary | ICD-10-CM | POA: Insufficient documentation

## 2012-08-25 DIAGNOSIS — M25569 Pain in unspecified knee: Secondary | ICD-10-CM | POA: Insufficient documentation

## 2012-08-29 ENCOUNTER — Ambulatory Visit: Payer: Medicare Other | Admitting: Physical Therapy

## 2012-08-31 ENCOUNTER — Ambulatory Visit: Payer: Medicare Other | Admitting: *Deleted

## 2012-09-05 ENCOUNTER — Ambulatory Visit: Payer: Medicare Other | Admitting: Physical Therapy

## 2012-09-08 ENCOUNTER — Ambulatory Visit: Payer: Medicare Other | Admitting: Physical Therapy

## 2012-09-12 ENCOUNTER — Ambulatory Visit: Payer: Medicare Other | Admitting: Physical Therapy

## 2012-09-14 ENCOUNTER — Encounter: Payer: Medicare Other | Admitting: Physical Therapy

## 2012-09-15 ENCOUNTER — Encounter: Payer: Medicare Other | Admitting: Physical Therapy

## 2012-09-19 ENCOUNTER — Ambulatory Visit: Payer: Medicare Other | Admitting: Physical Therapy

## 2012-09-21 ENCOUNTER — Ambulatory Visit: Payer: Medicare Other | Admitting: Physical Therapy

## 2012-09-26 ENCOUNTER — Ambulatory Visit: Payer: Medicare Other | Attending: Internal Medicine | Admitting: Physical Therapy

## 2012-09-26 DIAGNOSIS — Z96659 Presence of unspecified artificial knee joint: Secondary | ICD-10-CM | POA: Insufficient documentation

## 2012-09-26 DIAGNOSIS — IMO0001 Reserved for inherently not codable concepts without codable children: Secondary | ICD-10-CM | POA: Insufficient documentation

## 2012-09-26 DIAGNOSIS — M25569 Pain in unspecified knee: Secondary | ICD-10-CM | POA: Insufficient documentation

## 2012-09-26 DIAGNOSIS — M25669 Stiffness of unspecified knee, not elsewhere classified: Secondary | ICD-10-CM | POA: Insufficient documentation

## 2012-09-28 ENCOUNTER — Encounter: Payer: Medicare Other | Admitting: Physical Therapy

## 2012-09-29 ENCOUNTER — Ambulatory Visit: Payer: Medicare Other | Admitting: Physical Therapy

## 2012-11-10 ENCOUNTER — Ambulatory Visit: Payer: Medicare Other | Admitting: Physical Therapy

## 2012-11-10 DIAGNOSIS — M25669 Stiffness of unspecified knee, not elsewhere classified: Secondary | ICD-10-CM | POA: Insufficient documentation

## 2012-11-10 DIAGNOSIS — M25569 Pain in unspecified knee: Secondary | ICD-10-CM | POA: Insufficient documentation

## 2012-11-10 DIAGNOSIS — Z96659 Presence of unspecified artificial knee joint: Secondary | ICD-10-CM | POA: Insufficient documentation

## 2012-11-10 DIAGNOSIS — IMO0001 Reserved for inherently not codable concepts without codable children: Secondary | ICD-10-CM | POA: Insufficient documentation

## 2012-11-15 ENCOUNTER — Ambulatory Visit: Payer: Medicare Other | Attending: Neurosurgery | Admitting: *Deleted

## 2012-11-17 ENCOUNTER — Ambulatory Visit: Payer: Medicare Other | Admitting: Physical Therapy

## 2012-11-24 ENCOUNTER — Ambulatory Visit: Payer: Medicare Other | Attending: Neurosurgery | Admitting: Physical Therapy

## 2012-11-24 ENCOUNTER — Other Ambulatory Visit: Payer: Self-pay | Admitting: Orthopedic Surgery

## 2012-11-24 ENCOUNTER — Ambulatory Visit (INDEPENDENT_AMBULATORY_CARE_PROVIDER_SITE_OTHER): Payer: Medicare Other

## 2012-11-24 DIAGNOSIS — M25569 Pain in unspecified knee: Secondary | ICD-10-CM

## 2012-11-24 DIAGNOSIS — R52 Pain, unspecified: Secondary | ICD-10-CM

## 2012-11-24 DIAGNOSIS — M25669 Stiffness of unspecified knee, not elsewhere classified: Secondary | ICD-10-CM | POA: Insufficient documentation

## 2012-11-24 DIAGNOSIS — IMO0001 Reserved for inherently not codable concepts without codable children: Secondary | ICD-10-CM | POA: Insufficient documentation

## 2012-11-24 DIAGNOSIS — Z96659 Presence of unspecified artificial knee joint: Secondary | ICD-10-CM | POA: Insufficient documentation

## 2012-11-28 ENCOUNTER — Encounter: Payer: Medicare Other | Admitting: Physical Therapy

## 2012-11-30 ENCOUNTER — Ambulatory Visit: Payer: Medicare Other | Admitting: Physical Therapy

## 2012-12-05 ENCOUNTER — Encounter: Payer: Medicare Other | Admitting: Physical Therapy

## 2012-12-06 ENCOUNTER — Ambulatory Visit: Payer: Medicare Other | Admitting: Physical Therapy

## 2012-12-07 ENCOUNTER — Encounter: Payer: Medicare Other | Admitting: Physical Therapy

## 2012-12-08 ENCOUNTER — Encounter: Payer: Medicare Other | Admitting: Physical Therapy

## 2012-12-12 ENCOUNTER — Ambulatory Visit: Payer: Medicare Other | Admitting: Physical Therapy

## 2012-12-14 ENCOUNTER — Encounter: Payer: Medicare Other | Admitting: Physical Therapy

## 2012-12-15 ENCOUNTER — Ambulatory Visit: Payer: Medicare Other | Admitting: Physical Therapy

## 2012-12-19 ENCOUNTER — Encounter: Payer: Medicare Other | Admitting: Physical Therapy

## 2012-12-22 ENCOUNTER — Ambulatory Visit: Payer: Medicare Other | Admitting: Physical Therapy

## 2012-12-26 ENCOUNTER — Encounter: Payer: Medicare Other | Admitting: Physical Therapy

## 2012-12-28 ENCOUNTER — Encounter: Payer: Medicare Other | Admitting: Physical Therapy

## 2013-01-02 ENCOUNTER — Ambulatory Visit: Payer: Medicare Other | Attending: Neurosurgery | Admitting: Physical Therapy

## 2013-01-02 DIAGNOSIS — M25569 Pain in unspecified knee: Secondary | ICD-10-CM | POA: Insufficient documentation

## 2013-01-02 DIAGNOSIS — Z96659 Presence of unspecified artificial knee joint: Secondary | ICD-10-CM | POA: Insufficient documentation

## 2013-01-02 DIAGNOSIS — M25669 Stiffness of unspecified knee, not elsewhere classified: Secondary | ICD-10-CM | POA: Insufficient documentation

## 2013-01-02 DIAGNOSIS — IMO0001 Reserved for inherently not codable concepts without codable children: Secondary | ICD-10-CM | POA: Insufficient documentation

## 2013-01-05 ENCOUNTER — Encounter: Payer: Medicare Other | Admitting: Physical Therapy

## 2013-01-30 ENCOUNTER — Ambulatory Visit: Payer: Medicare Other | Admitting: Physical Therapy

## 2013-03-14 ENCOUNTER — Other Ambulatory Visit: Payer: Self-pay | Admitting: Neurosurgery

## 2013-03-14 DIAGNOSIS — M47817 Spondylosis without myelopathy or radiculopathy, lumbosacral region: Secondary | ICD-10-CM

## 2013-03-19 ENCOUNTER — Ambulatory Visit
Admission: RE | Admit: 2013-03-19 | Discharge: 2013-03-19 | Disposition: A | Discharge status: Home / Self Care | Payer: Medicare Other | Source: Ambulatory Visit | Attending: Neurosurgery | Admitting: Neurosurgery

## 2013-03-19 DIAGNOSIS — M47817 Spondylosis without myelopathy or radiculopathy, lumbosacral region: Secondary | ICD-10-CM

## 2013-03-21 ENCOUNTER — Ambulatory Visit: Payer: Medicare Other | Admitting: Physical Therapy

## 2013-03-24 ENCOUNTER — Ambulatory Visit (INDEPENDENT_AMBULATORY_CARE_PROVIDER_SITE_OTHER): Payer: Medicare Other | Admitting: Surgery

## 2013-03-28 ENCOUNTER — Ambulatory Visit: Payer: Medicare Other | Attending: Specialist | Admitting: Physical Therapy

## 2013-03-28 DIAGNOSIS — M25569 Pain in unspecified knee: Secondary | ICD-10-CM | POA: Insufficient documentation

## 2013-03-28 DIAGNOSIS — R5381 Other malaise: Secondary | ICD-10-CM | POA: Insufficient documentation

## 2013-03-28 DIAGNOSIS — M25519 Pain in unspecified shoulder: Secondary | ICD-10-CM | POA: Insufficient documentation

## 2013-03-30 ENCOUNTER — Ambulatory Visit: Payer: Medicare Other | Admitting: Physical Therapy

## 2013-04-03 ENCOUNTER — Ambulatory Visit: Payer: Medicare Other | Admitting: Physical Therapy

## 2013-04-06 ENCOUNTER — Encounter: Payer: Medicare Other | Admitting: Physical Therapy

## 2013-04-10 ENCOUNTER — Encounter: Payer: Medicare Other | Admitting: *Deleted

## 2013-04-28 ENCOUNTER — Ambulatory Visit (INDEPENDENT_AMBULATORY_CARE_PROVIDER_SITE_OTHER): Payer: Medicare Other | Admitting: Surgery

## 2013-04-28 ENCOUNTER — Encounter (INDEPENDENT_AMBULATORY_CARE_PROVIDER_SITE_OTHER): Payer: Self-pay | Admitting: Surgery

## 2013-04-28 VITALS — BP 130/84 | HR 80 | Temp 98.3°F | Resp 16 | Ht 63.5 in | Wt 267.0 lb

## 2013-04-28 DIAGNOSIS — T85698A Other mechanical complication of other specified internal prosthetic devices, implants and grafts, initial encounter: Secondary | ICD-10-CM

## 2013-04-28 DIAGNOSIS — K9509 Other complications of gastric band procedure: Secondary | ICD-10-CM

## 2013-04-28 NOTE — Progress Notes (Signed)
Miranda Keith 68 y.o.  Body mass index is 46.55 kg/(m^2).  Patient Active Problem List   Diagnosis Date Noted  . OA (osteoarthritis) of knee 07/13/2012  . OSA (obstructive sleep apnea) 10/21/2010  . COLONIC POLYPS, HYPERPLASTIC 03/12/2007  . HYPERCHOLESTEROLEMIA 03/12/2007  . OBESITY 03/12/2007  . CONSTIPATION 03/12/2007  . IRRITABLE BOWEL SYNDROME 03/12/2007  . MELANOSIS COLI 03/12/2007  . ARTHRITIS 03/12/2007  . BACK PAIN, CHRONIC 03/12/2007  . ABDOMINAL PAIN, LOWER 03/12/2007  . ESOPHAGITIS, REFLUX 01/17/2007  . HIATAL HERNIA 01/17/2007  . DIVERTICULOSIS, COLON 01/17/2007    No Known Allergies  Past Surgical History  Procedure Laterality Date  . Neck fusion  2001, 2013  . Back surgery      lower back  . Total knee arthroplasty      right  . Laparoscopic gastric banding  12/03/09  . Total abdominal hysterectomy  1994  . Appendectomy    . Total knee arthroplasty Left 07/13/2012    Procedure: LEFT TOTAL KNEE ARTHROPLASTY;  Surgeon: Gearlean Alf, MD;  Location: [facility];  Service: Orthopedics;  Laterality: Left;   SHAH,ASHISH, MD No diagnosis found.  Having trouble with burping and bloating. She discuss having her band out. A 1 to go into a band vacation and withdrew 5 cc of saline from her AP band. I think removing her band would probably put her risk for gastroesophageal reflux.  All fluid removed from band (5 cc)  Will reassess in 6 weeks. Matt B. Hassell Done, MD, [facility] [phone] beeper [phone]  04/28/2013 2:52 PM

## 2013-04-28 NOTE — Patient Instructions (Signed)
Good Carbs:  Brocccoli, asparagus, spinach, mustard greens,    Intermediate:  Beans  Bad Carbs: Potatoes (Follansbee, sweet, or French Fries), carrots, squash,  

## 2013-05-02 DIAGNOSIS — K9509 Other complications of gastric band procedure: Secondary | ICD-10-CM | POA: Insufficient documentation

## 2013-06-15 ENCOUNTER — Encounter (INDEPENDENT_AMBULATORY_CARE_PROVIDER_SITE_OTHER): Payer: Medicare Other | Admitting: Surgery

## 2013-10-10 ENCOUNTER — Ambulatory Visit: Payer: Medicare Other | Attending: Neurosurgery | Admitting: Physical Therapy

## 2013-10-10 DIAGNOSIS — M545 Low back pain, unspecified: Secondary | ICD-10-CM | POA: Insufficient documentation

## 2013-10-10 DIAGNOSIS — IMO0001 Reserved for inherently not codable concepts without codable children: Secondary | ICD-10-CM | POA: Insufficient documentation

## 2013-10-10 DIAGNOSIS — R5381 Other malaise: Secondary | ICD-10-CM | POA: Insufficient documentation

## 2013-10-10 DIAGNOSIS — Z96659 Presence of unspecified artificial knee joint: Secondary | ICD-10-CM | POA: Diagnosis not present

## 2013-10-12 ENCOUNTER — Ambulatory Visit: Payer: Medicare Other | Admitting: *Deleted

## 2013-10-12 DIAGNOSIS — IMO0001 Reserved for inherently not codable concepts without codable children: Secondary | ICD-10-CM | POA: Diagnosis not present

## 2013-10-16 ENCOUNTER — Ambulatory Visit: Payer: Medicare Other | Admitting: Physical Therapy

## 2013-10-16 DIAGNOSIS — IMO0001 Reserved for inherently not codable concepts without codable children: Secondary | ICD-10-CM | POA: Diagnosis not present

## 2013-10-18 ENCOUNTER — Ambulatory Visit: Payer: Medicare Other | Admitting: Physical Therapy

## 2013-10-18 DIAGNOSIS — IMO0001 Reserved for inherently not codable concepts without codable children: Secondary | ICD-10-CM | POA: Diagnosis not present

## 2013-10-23 ENCOUNTER — Ambulatory Visit: Payer: Medicare Other | Admitting: Physical Therapy

## 2013-10-23 DIAGNOSIS — IMO0001 Reserved for inherently not codable concepts without codable children: Secondary | ICD-10-CM | POA: Diagnosis not present

## 2013-10-25 ENCOUNTER — Ambulatory Visit: Payer: Medicare Other | Attending: Neurosurgery | Admitting: Physical Therapy

## 2013-10-25 DIAGNOSIS — Z96659 Presence of unspecified artificial knee joint: Secondary | ICD-10-CM | POA: Insufficient documentation

## 2013-10-25 DIAGNOSIS — M545 Low back pain, unspecified: Secondary | ICD-10-CM | POA: Insufficient documentation

## 2013-10-25 DIAGNOSIS — R5381 Other malaise: Secondary | ICD-10-CM | POA: Diagnosis not present

## 2013-10-25 DIAGNOSIS — IMO0001 Reserved for inherently not codable concepts without codable children: Secondary | ICD-10-CM | POA: Insufficient documentation

## 2013-10-31 ENCOUNTER — Ambulatory Visit: Payer: Medicare Other | Admitting: Physical Therapy

## 2013-10-31 DIAGNOSIS — IMO0001 Reserved for inherently not codable concepts without codable children: Secondary | ICD-10-CM | POA: Diagnosis not present

## 2013-11-02 ENCOUNTER — Encounter: Payer: Medicare Other | Admitting: Physical Therapy

## 2013-11-06 ENCOUNTER — Encounter: Payer: Medicare Other | Admitting: Physical Therapy

## 2013-11-08 ENCOUNTER — Encounter: Payer: Medicare Other | Admitting: Physical Therapy

## 2013-11-13 ENCOUNTER — Encounter: Payer: Medicare Other | Admitting: Physical Therapy

## 2013-11-15 ENCOUNTER — Encounter: Payer: Medicare Other | Admitting: Physical Therapy

## 2013-11-30 ENCOUNTER — Ambulatory Visit: Payer: Medicare Other | Attending: Neurosurgery | Admitting: Physical Therapy

## 2013-11-30 DIAGNOSIS — M47896 Other spondylosis, lumbar region: Secondary | ICD-10-CM | POA: Insufficient documentation

## 2013-11-30 DIAGNOSIS — M545 Low back pain: Secondary | ICD-10-CM | POA: Diagnosis not present

## 2013-11-30 DIAGNOSIS — Z9889 Other specified postprocedural states: Secondary | ICD-10-CM | POA: Insufficient documentation

## 2013-11-30 DIAGNOSIS — Z96653 Presence of artificial knee joint, bilateral: Secondary | ICD-10-CM | POA: Insufficient documentation

## 2013-11-30 DIAGNOSIS — R5381 Other malaise: Secondary | ICD-10-CM | POA: Diagnosis not present

## 2013-12-04 ENCOUNTER — Ambulatory Visit: Payer: Medicare Other | Admitting: Physical Therapy

## 2013-12-04 DIAGNOSIS — M47896 Other spondylosis, lumbar region: Secondary | ICD-10-CM | POA: Diagnosis not present

## 2013-12-06 ENCOUNTER — Encounter: Payer: Medicare Other | Admitting: Physical Therapy

## 2014-01-02 ENCOUNTER — Ambulatory Visit (INDEPENDENT_AMBULATORY_CARE_PROVIDER_SITE_OTHER): Payer: Medicare Other | Admitting: *Deleted

## 2014-01-02 ENCOUNTER — Encounter: Payer: Medicare Other | Admitting: *Deleted

## 2014-01-02 DIAGNOSIS — Z23 Encounter for immunization: Secondary | ICD-10-CM

## 2014-07-27 NOTE — Progress Notes (Signed)
Need orders for 08-07-14 surgery in epic

## 2014-07-27 NOTE — Progress Notes (Signed)
Please put orders in Epic surgery 08-07-14 pre op 08-01-14 Thanks

## 2014-07-31 NOTE — Progress Notes (Signed)
Need orders in EPIC .  Surgery o 08/07/2014.  Preop on 08/01/2014.  Thank You.

## 2014-07-31 NOTE — Patient Instructions (Signed)
Miranda Keith  07/31/2014   Your procedure is scheduled on:     08/07/2014    Report to [facility] Main  Entrance and follow signs to               [place] at     [phone] AM.  Call this number if you have problems the morning of surgery [phone]   Remember: ONLY 1 PERSON MAY GO WITH YOU TO SHORT STAY TO GET  READY MORNING OF [place].  Do not eat food or drink liquids :After Midnight.     Take these medicines the morning of surgery with A SIP OF WATER: Zyrtec                               You may not have any metal on your body including hair pins and              piercings  Do not wear jewelry, make-up, lotions, powders or perfumes, deodorant             Do not wear nail polish.  Do not shave  48 hours prior to surgery.                 Do not bring valuables to the hospital. [place].  Contacts, dentures or bridgework may not be worn into surgery.  Leave suitcase in the car. After surgery it may be brought to your room.      Special Instructions: coughing and deep breathing exercises, leg exercises               Please read over the following fact sheets you were given: _____________________________________________________________________             [facility] - Preparing for Surgery Before surgery, you can play an important role.  Because skin is not sterile, your skin needs to be as free of germs as possible.  You can reduce the number of germs on your skin by washing with CHG (chlorahexidine gluconate) soap before surgery.  CHG is an antiseptic cleaner which kills germs and bonds with the skin to continue killing germs even after washing. Please DO NOT use if you have an allergy to CHG or antibacterial soaps.  If your skin becomes reddened/irritated stop using the CHG and inform your nurse when you arrive at Short Stay. Do not shave (including legs and underarms) for at least 48 hours  prior to the first CHG shower.  You may shave your face/neck. Please follow these instructions carefully:  1.  Shower with CHG Soap the night before surgery and the  morning of Surgery.  2.  If you choose to wash your hair, wash your hair first as usual with your  normal  shampoo.  3.  After you shampoo, rinse your hair and body thoroughly to remove the  shampoo.                           4.  Use CHG as you would any other liquid soap.  You can apply chg directly  to the skin and wash  Gently with a scrungie or clean washcloth.  5.  Apply the CHG Soap to your body ONLY FROM THE NECK DOWN.   Do not use on face/ open                           Wound or open sores. Avoid contact with eyes, ears mouth and genitals (private parts).                       Wash face,  Genitals (private parts) with your normal soap.             6.  Wash thoroughly, paying special attention to the area where your surgery  will be performed.  7.  Thoroughly rinse your body with warm water from the neck down.  8.  DO NOT shower/wash with your normal soap after using and rinsing off  the CHG Soap.                9.  Pat yourself dry with a clean towel.            10.  Wear clean pajamas.            11.  Place clean sheets on your bed the night of your first shower and do not  sleep with pets. Day of Surgery : Do not apply any lotions/deodorants the morning of surgery.  Please wear clean clothes to the hospital/surgery center.  FAILURE TO FOLLOW THESE INSTRUCTIONS MAY RESULT IN THE CANCELLATION OF YOUR SURGERY PATIENT SIGNATURE_________________________________  NURSE SIGNATURE__________________________________  ________________________________________________________________________

## 2014-08-01 ENCOUNTER — Ambulatory Visit: Payer: Self-pay | Admitting: Surgery

## 2014-08-01 ENCOUNTER — Encounter (HOSPITAL_COMMUNITY)
Admission: RE | Admit: 2014-08-01 | Discharge: 2014-08-01 | Disposition: A | Discharge status: Home / Self Care | Payer: Medicare Other | Source: Ambulatory Visit | Attending: Surgery | Admitting: Surgery

## 2014-08-01 ENCOUNTER — Encounter (HOSPITAL_COMMUNITY): Payer: Self-pay

## 2014-08-01 DIAGNOSIS — Z01818 Encounter for other preprocedural examination: Secondary | ICD-10-CM | POA: Diagnosis not present

## 2014-08-01 HISTORY — DX: Personal history of other diseases of the digestive system: Z87.19

## 2014-08-01 HISTORY — DX: Corns and callosities: L84

## 2014-08-01 LAB — BASIC METABOLIC PANEL
Anion gap: 9 (ref 5–15)
BUN: 16 mg/dL (ref 6–20)
CALCIUM: 9.2 mg/dL (ref 8.9–10.3)
CO2: 27 mmol/L (ref 22–32)
CREATININE: 0.48 mg/dL (ref 0.44–1.00)
Chloride: 106 mmol/L (ref 101–111)
GFR calc Af Amer: >60 mL/min (ref >60)
Glucose, Bld: 94 mg/dL (ref 65–99)
POTASSIUM: 4.2 mmol/L (ref 3.5–5.1)
SODIUM: 142 mmol/L (ref 135–145)

## 2014-08-01 LAB — CBC
HEMATOCRIT: 44.3 % (ref 36.0–46.0)
Hemoglobin: 14.7 g/dL (ref 12.0–15.0)
MCH: 30.6 pg (ref 26.0–34.0)
MCHC: 33.2 g/dL (ref 30.0–36.0)
MCV: 92.1 fL (ref 78.0–100.0)
Platelets: 240 10*3/uL (ref 150–400)
RBC: 4.81 MIL/uL (ref 3.87–5.11)
RDW: 13.1 % (ref 11.5–15.5)
WBC: 6.6 10*3/uL (ref 4.0–10.5)

## 2014-08-01 NOTE — H&P (Signed)
Miranda Keith 06/06/2014 4:03 PM Location: [facility] Patient #: [id number] DOB: [birth year] Widowed / Language: Miranda Keith / Race: [place] Female  History of Present Illness Rodman Key B. Hassell Done MD; 06/06/2014 4:56 PM) Patient words: lap band-upper abdominal discomfort and wants lapband removed.  We discussed options and she wants to have it removed.   Has maintained only weight loss of about 12 lbs since it was placed in 2011. In the meantime, she has had a lot of back surgery for disc disease.    Medicare approved for removal of failed lapband now with upper abdominal discomfort and bloating.     Other Problems Miranda Keith, CMA; 06/06/2014 4:03 PM) Arthritis Back Pain Diverticulosis Gastroesophageal Reflux Disease Oophorectomy  Past Surgical History Miranda Keith, CMA; 06/06/2014 4:03 PM) Appendectomy Breast Augmentation Bilateral. Cataract Surgery Bilateral. Colon Polyp Removal - Colonoscopy Hysterectomy (not due to cancer) - Complete Lap Band Shoulder Surgery Left. Spinal Surgery - Lower Back Spinal Surgery - Neck Tonsillectomy  Diagnostic Studies History Miranda Keith, CMA; 06/06/2014 4:03 PM) Colonoscopy 1-5 years ago Mammogram within last year  Allergies Miranda Keith, CMA; 06/06/2014 4:03 PM) No Known Drug Allergies04/13/2016  Medication History Miranda Keith, CMA; 06/06/2014 4:04 PM) ClonazePAM (0.5MG  Tablet, Oral) Active. TraMADol HCl (50MG  Tablet, Oral) Active. Lyrica (25MG  Capsule, Oral) Active. Amoxicillin (500MG  Capsule, Oral) Active. Ciprofloxacin HCl (500MG  Tablet, Oral) Active. MetroNIDAZOLE (500MG  Tablet, Oral) Active. Omeprazole (20MG  Capsule DR, Oral) Active. PredniSONE (5MG  Tablet, Oral) Active. TiZANidine HCl (4MG  Tablet, Oral) Active. Voltaren (1% Gel, Transdermal) Active. Medications Reconciled  Social History Miranda Keith, [place]; 06/06/2014 4:03 PM) Alcohol use Occasional alcohol use. Caffeine use Tea. No drug  use  Family History Miranda Keith, [place]; 06/06/2014 4:03 PM) Arthritis Father, Mother. Colon Polyps Daughter. Diabetes Mellitus Daughter. Hypertension Daughter, Mother. Prostate Cancer Father. Respiratory Condition Mother.  Pregnancy / Birth History Miranda Keith, [place]; 06/06/2014 4:03 PM) Age at menarche 84 years. Age of menopause 42-50 Gravida 1 Maternal age 54-20 Para 1  Review of Systems Miranda Keith CMA; 06/06/2014 4:03 PM) General Present- Fatigue. Not Present- Appetite Loss, Chills, Fever, Night Sweats, Weight Gain and Weight Loss. HEENT Present- Seasonal Allergies and Wears glasses/contact lenses. Not Present- Earache, Hearing Loss, Hoarseness, Nose Bleed, Oral Ulcers, Ringing in the Ears, Sinus Pain, Sore Throat, Visual Disturbances and Yellow Eyes. Respiratory Present- Snoring. Not Present- Bloody sputum, Chronic Cough, Difficulty Breathing and Wheezing. Breast Not Present- Breast Mass, Breast Pain, Nipple Discharge and Skin Changes. Cardiovascular Present- Leg Cramps and Swelling of Extremities. Not Present- Chest Pain, Difficulty Breathing Lying Down, Palpitations, Rapid Heart Rate and Shortness of Breath. Gastrointestinal Present- Bloating, Constipation and Excessive gas. Not Present- Abdominal Pain, Bloody Stool, Change in Bowel Habits, Chronic diarrhea, Difficulty Swallowing, Gets full quickly at meals, Hemorrhoids, Indigestion, Nausea, Rectal Pain and Vomiting. Female Genitourinary Not Present- Frequency, Nocturia, Painful Urination, Pelvic Pain and Urgency. Musculoskeletal Present- Back Pain. Not Present- Joint Pain, Joint Stiffness, Muscle Pain, Muscle Weakness and Swelling of Extremities. Neurological Present- Numbness, Tingling and Trouble walking. Not Present- Decreased Memory, Fainting, Headaches, Seizures, Tremor and Weakness. Endocrine Present- Heat Intolerance. Not Present- Cold Intolerance, Excessive Hunger, Hair Changes, Hot flashes and New  Diabetes.   Vitals Miranda Keith CMA; 06/06/2014 4:05 PM) 06/06/2014 4:04 PM Weight: 255 lb Height: 63.5in Body Surface Area: 2.28 m Body Mass Index: 44.46 kg/m Temp.: 98.20F(Oral)  Pulse: 80 (Regular)  Resp.: 16 (Unlabored)  BP: 126/70 (Sitting, Left Arm, Standard)    Physical Exam (Glady Ouderkirk B. Hassell Done MD; 06/06/2014 4:56 PM) Eldridge Dace unremarakable  Neck supple Chest clear Heart SR Abdomen Lapband port is readily palpable. Ext FROM    Assessment & Plan Rodman Key B. Hassell Done MD; 06/06/2014 4:57 PM) GASTRIC BANDING STATUS (V45.86  Z98.84)-Not functioning Plan:  Laparoscopic removal of lapband.

## 2014-08-03 NOTE — Progress Notes (Signed)
Ivor Costa, medical assistant of Dr Hassell Done with [facility] called back and stated she had received OV note from Dr Irving Shows which I faxed to her and will let Dr Hassell Done be aware.

## 2014-08-03 NOTE — Progress Notes (Signed)
Called to [facility] and attempted to speak with Ivor Costa, assistant to Dr Johnathan Hausen.  She was out to lunch.  Faxed OV note dated 07/31/2014 from Dr Irving Shows on patient regarding foot issue on the right.  Received fax confirmation on this note.

## 2014-08-03 NOTE — Progress Notes (Signed)
Miranda Keith , Dr Hassell Done medical assistant called back and stated Dr Hassell Done aware of foot issue.  No further orders given.  To proceed with surgery per Miranda Keith per Dr Hassell Done.

## 2014-08-07 ENCOUNTER — Encounter (HOSPITAL_COMMUNITY)
Admission: RE | Disposition: A | Discharge status: Home / Self Care | Payer: Self-pay | Source: Ambulatory Visit | Attending: Surgery

## 2014-08-07 ENCOUNTER — Encounter (HOSPITAL_COMMUNITY): Payer: Self-pay | Admitting: *Deleted

## 2014-08-07 ENCOUNTER — Observation Stay (HOSPITAL_COMMUNITY)
Admission: RE | Admit: 2014-08-07 | Discharge: 2014-08-08 | Disposition: A | Discharge status: Home / Self Care | Payer: Medicare Other | Source: Ambulatory Visit | Attending: Surgery | Admitting: Surgery

## 2014-08-07 ENCOUNTER — Inpatient Hospital Stay (HOSPITAL_COMMUNITY): Payer: Medicare Other | Admitting: Anesthesiology

## 2014-08-07 DIAGNOSIS — IMO0001 Reserved for inherently not codable concepts without codable children: Secondary | ICD-10-CM

## 2014-08-07 DIAGNOSIS — K219 Gastro-esophageal reflux disease without esophagitis: Secondary | ICD-10-CM | POA: Insufficient documentation

## 2014-08-07 DIAGNOSIS — G4733 Obstructive sleep apnea (adult) (pediatric): Secondary | ICD-10-CM | POA: Diagnosis not present

## 2014-08-07 DIAGNOSIS — K449 Diaphragmatic hernia without obstruction or gangrene: Secondary | ICD-10-CM | POA: Insufficient documentation

## 2014-08-07 DIAGNOSIS — K9509 Other complications of gastric band procedure: Secondary | ICD-10-CM | POA: Diagnosis not present

## 2014-08-07 DIAGNOSIS — Z6841 Body Mass Index (BMI) 40.0 and over, adult: Secondary | ICD-10-CM | POA: Insufficient documentation

## 2014-08-07 DIAGNOSIS — Y733 Surgical instruments, materials and gastroenterology and urology devices (including sutures) associated with adverse incidents: Secondary | ICD-10-CM | POA: Insufficient documentation

## 2014-08-07 DIAGNOSIS — T85518S Breakdown (mechanical) of other gastrointestinal prosthetic devices, implants and grafts, sequela: Secondary | ICD-10-CM

## 2014-08-07 DIAGNOSIS — M1990 Unspecified osteoarthritis, unspecified site: Secondary | ICD-10-CM | POA: Diagnosis not present

## 2014-08-07 LAB — CBC
HEMATOCRIT: 42.9 % (ref 36.0–46.0)
HEMOGLOBIN: 13.9 g/dL (ref 12.0–15.0)
MCH: 29.3 pg (ref 26.0–34.0)
MCHC: 32.4 g/dL (ref 30.0–36.0)
MCV: 90.5 fL (ref 78.0–100.0)
Platelets: 200 10*3/uL (ref 150–400)
RBC: 4.74 MIL/uL (ref 3.87–5.11)
RDW: 13 % (ref 11.5–15.5)
WBC: 7.9 10*3/uL (ref 4.0–10.5)

## 2014-08-07 LAB — CREATININE, SERUM: CREATININE: 0.46 mg/dL (ref 0.44–1.00)

## 2014-08-07 SURGERY — REMOVAL, GASTRIC BAND, LAPAROSCOPIC
Anesthesia: General | Site: Abdomen

## 2014-08-07 MED ORDER — TRAMADOL HCL 50 MG PO TABS
50.0000 mg | ORAL_TABLET | Freq: Four times a day (QID) | ORAL | Status: DC | PRN
  Administered 2014-08-07: 50 mg via ORAL
  Filled 2014-08-07: qty 1

## 2014-08-07 MED ORDER — METHOCARBAMOL 500 MG PO TABS: 500.0000 mg | ORAL_TABLET | Freq: Four times a day (QID) | ORAL | Status: DC | PRN

## 2014-08-07 MED ORDER — LACTATED RINGERS IV SOLN
INTRAVENOUS | Status: DC
  Administered 2014-08-07: 1000 mL via INTRAVENOUS

## 2014-08-07 MED ORDER — SODIUM CHLORIDE 0.9 % IJ SOLN
INTRAMUSCULAR | Status: AC
  Filled 2014-08-07: qty 10

## 2014-08-07 MED ORDER — FUROSEMIDE 20 MG PO TABS
20.0000 mg | ORAL_TABLET | Freq: Every day | ORAL | Status: DC | PRN
  Filled 2014-08-07: qty 1

## 2014-08-07 MED ORDER — CEPHALEXIN 500 MG PO CAPS
500.0000 mg | ORAL_CAPSULE | Freq: Three times a day (TID) | ORAL | Status: DC
  Administered 2014-08-07 – 2014-08-08 (×3): 500 mg via ORAL
  Filled 2014-08-07 (×5): qty 1

## 2014-08-07 MED ORDER — HEPARIN SODIUM (PORCINE) 5000 UNIT/ML IJ SOLN
5000.0000 [IU] | Freq: Three times a day (TID) | INTRAMUSCULAR | Status: DC
  Administered 2014-08-07 – 2014-08-08 (×2): 5000 [IU] via SUBCUTANEOUS
  Filled 2014-08-07 (×5): qty 1

## 2014-08-07 MED ORDER — LIDOCAINE HCL (CARDIAC) 20 MG/ML IV SOLN
INTRAVENOUS | Status: DC | PRN
  Administered 2014-08-07: 100 mg via INTRAVENOUS

## 2014-08-07 MED ORDER — LACTATED RINGERS IV SOLN
INTRAVENOUS | Status: DC
  Administered 2014-08-07: 13:00:00 via INTRAVENOUS

## 2014-08-07 MED ORDER — BUPIVACAINE LIPOSOME 1.3 % IJ SUSP
20.0000 mL | Freq: Once | INTRAMUSCULAR | Status: AC
  Administered 2014-08-07: 20 mL
  Filled 2014-08-07: qty 20

## 2014-08-07 MED ORDER — DEXAMETHASONE SODIUM PHOSPHATE 10 MG/ML IJ SOLN
INTRAMUSCULAR | Status: DC | PRN
  Administered 2014-08-07: 10 mg via INTRAVENOUS

## 2014-08-07 MED ORDER — KCL IN DEXTROSE-NACL 20-5-0.45 MEQ/L-%-% IV SOLN
INTRAVENOUS | Status: DC
  Administered 2014-08-07: 18:00:00 via INTRAVENOUS
  Filled 2014-08-07 (×2): qty 1000

## 2014-08-07 MED ORDER — NEOSTIGMINE METHYLSULFATE 10 MG/10ML IV SOLN
INTRAVENOUS | Status: DC | PRN
  Administered 2014-08-07: 4 mg via INTRAVENOUS

## 2014-08-07 MED ORDER — SUCCINYLCHOLINE CHLORIDE 20 MG/ML IJ SOLN
INTRAMUSCULAR | Status: DC | PRN
  Administered 2014-08-07: 100 mg via INTRAVENOUS

## 2014-08-07 MED ORDER — CEFOXITIN SODIUM 2 G IV SOLR
2.0000 g | INTRAVENOUS | Status: AC
  Administered 2014-08-07: 2 g via INTRAVENOUS

## 2014-08-07 MED ORDER — CISATRACURIUM BESYLATE (PF) 10 MG/5ML IV SOLN
INTRAVENOUS | Status: DC | PRN
  Administered 2014-08-07: 5 mg via INTRAVENOUS

## 2014-08-07 MED ORDER — GLYCOPYRROLATE 0.2 MG/ML IJ SOLN
INTRAMUSCULAR | Status: DC | PRN
  Administered 2014-08-07: .6 mg via INTRAVENOUS

## 2014-08-07 MED ORDER — HEPARIN SODIUM (PORCINE) 5000 UNIT/ML IJ SOLN
5000.0000 [IU] | INTRAMUSCULAR | Status: AC
  Administered 2014-08-07: 5000 [IU] via SUBCUTANEOUS
  Filled 2014-08-07: qty 1

## 2014-08-07 MED ORDER — CHLORHEXIDINE GLUCONATE CLOTH 2 % EX PADS: 6.0000 | MEDICATED_PAD | Freq: Once | CUTANEOUS | Status: DC

## 2014-08-07 MED ORDER — LIDOCAINE HCL (CARDIAC) 20 MG/ML IV SOLN
INTRAVENOUS | Status: AC
  Filled 2014-08-07: qty 5

## 2014-08-07 MED ORDER — LIP MEDEX EX OINT
TOPICAL_OINTMENT | CUTANEOUS | Status: AC
  Administered 2014-08-07: 21:00:00
  Filled 2014-08-07: qty 7

## 2014-08-07 MED ORDER — DEXAMETHASONE SODIUM PHOSPHATE 10 MG/ML IJ SOLN
INTRAMUSCULAR | Status: AC
  Filled 2014-08-07: qty 1

## 2014-08-07 MED ORDER — HYDROMORPHONE HCL 1 MG/ML IJ SOLN: 0.2500 mg | INTRAMUSCULAR | Status: DC | PRN

## 2014-08-07 MED ORDER — FENTANYL CITRATE (PF) 100 MCG/2ML IJ SOLN
INTRAMUSCULAR | Status: DC | PRN
  Administered 2014-08-07: 150 ug via INTRAVENOUS

## 2014-08-07 MED ORDER — POTASSIUM CHLORIDE ER 10 MEQ PO TBCR
10.0000 meq | EXTENDED_RELEASE_TABLET | Freq: Every day | ORAL | Status: DC
  Administered 2014-08-07 – 2014-08-08 (×2): 10 meq via ORAL
  Filled 2014-08-07 (×2): qty 1

## 2014-08-07 MED ORDER — ONDANSETRON HCL 4 MG/2ML IJ SOLN
INTRAMUSCULAR | Status: DC | PRN
  Administered 2014-08-07: 4 mg via INTRAVENOUS

## 2014-08-07 MED ORDER — DEXTROSE 5 % IV SOLN
INTRAVENOUS | Status: AC
  Filled 2014-08-07: qty 2

## 2014-08-07 MED ORDER — PROPOFOL 10 MG/ML IV BOLUS
INTRAVENOUS | Status: AC
  Filled 2014-08-07: qty 20

## 2014-08-07 MED ORDER — PROPOFOL 10 MG/ML IV BOLUS
INTRAVENOUS | Status: DC | PRN
  Administered 2014-08-07: 30 mg via INTRAVENOUS
  Administered 2014-08-07: 120 mg via INTRAVENOUS

## 2014-08-07 MED ORDER — SODIUM CHLORIDE 0.9 % IJ SOLN
INTRAMUSCULAR | Status: DC | PRN
  Administered 2014-08-07: 10 mL via INTRAVENOUS

## 2014-08-07 MED ORDER — ONDANSETRON HCL 4 MG/2ML IJ SOLN
INTRAMUSCULAR | Status: AC
  Filled 2014-08-07: qty 2

## 2014-08-07 MED ORDER — MIDAZOLAM HCL 2 MG/2ML IJ SOLN
INTRAMUSCULAR | Status: AC
  Filled 2014-08-07: qty 2

## 2014-08-07 MED ORDER — FENTANYL CITRATE (PF) 250 MCG/5ML IJ SOLN
INTRAMUSCULAR | Status: AC
  Filled 2014-08-07: qty 5

## 2014-08-07 SURGICAL SUPPLY — 42 items
APL SKNCLS STERI-STRIP NONHPOA (GAUZE/BANDAGES/DRESSINGS)
BENZOIN TINCTURE PRP APPL 2/3 (GAUZE/BANDAGES/DRESSINGS) IMPLANT
BLADE HEX COATED 2.75 (ELECTRODE) ×3 IMPLANT
BLADE SURG 15 STRL LF DISP TIS (BLADE) ×1 IMPLANT
BLADE SURG 15 STRL SS (BLADE) ×3
CLOSURE WOUND 1/2 X4 (GAUZE/BANDAGES/DRESSINGS)
DECANTER SPIKE VIAL GLASS SM (MISCELLANEOUS) ×6 IMPLANT
DISSECTOR BLUNT TIP ENDO 5MM (MISCELLANEOUS) IMPLANT
DRAPE CAMERA CLOSED 9X96 (DRAPES) ×3 IMPLANT
ELECT REM PT RETURN 9FT ADLT (ELECTROSURGICAL) ×3
ELECTRODE REM PT RTRN 9FT ADLT (ELECTROSURGICAL) ×1 IMPLANT
GAUZE SPONGE 4X4 12PLY STRL (GAUZE/BANDAGES/DRESSINGS) ×3 IMPLANT
GLOVE BIOGEL M 8.0 STRL (GLOVE) ×3 IMPLANT
GLOVE BIOGEL PI IND STRL 7.0 (GLOVE) ×1 IMPLANT
GLOVE BIOGEL PI INDICATOR 7.0 (GLOVE) ×2
GOWN SPEC L4 XLG W/TWL (GOWN DISPOSABLE) ×3 IMPLANT
GOWN STRL REUS W/TWL XL LVL3 (GOWN DISPOSABLE) ×9 IMPLANT
HOVERMATT SINGLE USE (MISCELLANEOUS) ×3 IMPLANT
KIT BASIN OR (CUSTOM PROCEDURE TRAY) ×3 IMPLANT
NDL SPNL 22GX3.5 QUINCKE BK (NEEDLE) ×1 IMPLANT
NEEDLE SPNL 22GX3.5 QUINCKE BK (NEEDLE) ×3 IMPLANT
NS IRRIG 1000ML POUR BTL (IV SOLUTION) ×3 IMPLANT
PACK UNIVERSAL I (CUSTOM PROCEDURE TRAY) ×3 IMPLANT
PENCIL BUTTON HOLSTER BLD 10FT (ELECTRODE) ×3 IMPLANT
SCISSORS LAP 5X45 EPIX DISP (ENDOMECHANICALS) ×3 IMPLANT
SET IRRIG TUBING LAPAROSCOPIC (IRRIGATION / IRRIGATOR) ×2 IMPLANT
SHEARS CURVED HARMONIC AC 45CM (MISCELLANEOUS) IMPLANT
SLEEVE ADV FIXATION 5X100MM (TROCAR) ×3 IMPLANT
SOLUTION ANTI FOG 6CC (MISCELLANEOUS) ×3 IMPLANT
SPONGE LAP 18X18 X RAY DECT (DISPOSABLE) ×3 IMPLANT
STAPLER VISISTAT 35W (STAPLE) ×3 IMPLANT
STRIP CLOSURE SKIN 1/2X4 (GAUZE/BANDAGES/DRESSINGS) IMPLANT
SUT VIC AB 2-0 SH 27 (SUTURE) ×3
SUT VIC AB 2-0 SH 27X BRD (SUTURE) IMPLANT
SUT VIC AB 4-0 SH 18 (SUTURE) ×3 IMPLANT
SYR 20CC LL (SYRINGE) ×3 IMPLANT
TOWEL OR 17X26 10 PK STRL BLUE (TOWEL DISPOSABLE) ×6 IMPLANT
TOWEL OR NON WOVEN STRL DISP B (DISPOSABLE) ×3 IMPLANT
TROCAR ADV FIXATION 5X100MM (TROCAR) ×3 IMPLANT
TROCAR BLADELESS 15MM (ENDOMECHANICALS) ×3 IMPLANT
TROCAR BLADELESS OPT 5 100 (ENDOMECHANICALS) ×3 IMPLANT
TUBING INSUFFLATION 10FT LAP (TUBING) ×3 IMPLANT

## 2014-08-07 NOTE — Transfer of Care (Signed)
Immediate Anesthesia Transfer of Care Note  Patient: Miranda Keith  Procedure(s) Performed: Procedure(s): LAPAROSCOPIC REMOVAL OF GASTRIC BAND (N/A)  Patient Location: PACU  Anesthesia Type:General  Level of Consciousness: awake, alert  and oriented  Airway & Oxygen Therapy: Patient Spontanous Breathing and Patient connected to face mask oxygen  Post-op Assessment: Report given to RN and Post -op Vital signs reviewed and stable  Post vital signs: Reviewed and stable  Last Vitals:  Filed Vitals:   08/07/14 0857  BP: 141/72  Pulse: 83  Temp: 36.4 C  Resp: 16    Complications: No apparent anesthesia complications

## 2014-08-07 NOTE — Anesthesia Postprocedure Evaluation (Signed)
  Anesthesia Post-op Note  Patient: Miranda Keith  Procedure(s) Performed: Procedure(s) (LRB): LAPAROSCOPIC REMOVAL OF GASTRIC BAND (N/A)  Patient Location: PACU  Anesthesia Type: General  Level of Consciousness: awake and alert   Airway and Oxygen Therapy: Patient Spontanous Breathing  Post-op Pain: mild  Post-op Assessment: Post-op Vital signs reviewed, Patient's Cardiovascular Status Stable, Respiratory Function Stable, Patent Airway and No signs of Nausea or vomiting  Last Vitals:  Filed Vitals:   08/07/14 1315  BP: 123/69  Pulse: 67  Temp: 36.4 C  Resp: 17    Post-op Vital Signs: stable   Complications: No apparent anesthesia complications

## 2014-08-07 NOTE — Addendum Note (Signed)
Addendum  created 08/07/14 1505 by Sharlette Dense, CRNA   Modules edited: Anesthesia Medication Administration

## 2014-08-07 NOTE — Interval H&P Note (Signed)
History and Physical Interval Note:  08/07/2014 10:29 AM  Miranda Keith  has presented today for surgery, with the diagnosis of MORBID OBESITY  The various methods of treatment have been discussed with the patient and family. After consideration of risks, benefits and other options for treatment, the patient has consented to  Procedure(s): LAPAROSCOPIC REMOVAL OF GASTRIC BAND (N/A) as a surgical intervention .  The patient's history has been reviewed, patient examined, no change in status, stable for surgery.  I have reviewed the patient's chart and labs.  Questions were answered to the patient's satisfaction.     Emer Onnen B

## 2014-08-07 NOTE — H&P (View-Only) (Signed)
atsy G. Guillot 06/06/2014 4:03 PM Location: [facility] Patient #: [id number] DOB: [birth year] Widowed / Language: [name] / Race: [place] Female  History of Present Illness Rodman Key B. Hassell Done MD; 06/06/2014 4:56 PM) Patient words: lap band-upper abdominal discomfort and wants lapband removed.  We discussed options and she wants to have it removed.   Has maintained only weight loss of about 12 lbs since it was placed in 2011. In the meantime, she has had a lot of back surgery for disc disease.    Medicare approved for removal of failed lapband now with upper abdominal discomfort and bloating.     Other Problems [name], CMA; 06/06/2014 4:03 PM) Arthritis Back Pain Diverticulosis Gastroesophageal Reflux Disease Oophorectomy  Past Surgical History [name], CMA; 06/06/2014 4:03 PM) Appendectomy Breast Augmentation Bilateral. Cataract Surgery Bilateral. Colon Polyp Removal - Colonoscopy Hysterectomy (not due to cancer) - Complete Lap Band Shoulder Surgery Left. Spinal Surgery - Lower Back Spinal Surgery - Neck Tonsillectomy  Diagnostic Studies History [name], CMA; 06/06/2014 4:03 PM) Colonoscopy 1-5 years ago Mammogram within last year  Allergies [name], CMA; 06/06/2014 4:03 PM) No Known Drug Allergies04/13/2016  Medication History [name], CMA; 06/06/2014 4:04 PM) ClonazePAM (0.5MG  Tablet, Oral) Active. TraMADol HCl (50MG  Tablet, Oral) Active. Lyrica (25MG  Capsule, Oral) Active. Amoxicillin (500MG  Capsule, Oral) Active. Ciprofloxacin HCl (500MG  Tablet, Oral) Active. MetroNIDAZOLE (500MG  Tablet, Oral) Active. Omeprazole (20MG  Capsule DR, Oral) Active. PredniSONE (5MG  Tablet, Oral) Active. TiZANidine HCl (4MG  Tablet, Oral) Active. Voltaren (1% Gel, Transdermal) Active. Medications Reconciled  Social History [name], [place]; 06/06/2014 4:03 PM) Alcohol use Occasional alcohol use. Caffeine use Tea. No drug  use  Family History [name], [place]; 06/06/2014 4:03 PM) Arthritis Father, Mother. Colon Polyps Daughter. Diabetes Mellitus Daughter. Hypertension Daughter, Mother. Prostate Cancer Father. Respiratory Condition Mother.  Pregnancy / Birth History [name], [place]; 06/06/2014 4:03 PM) Age at menarche 79 years. Age of menopause 25-50 Gravida 1 Maternal age 47-20 Para 1  Review of Systems [name] CMA; 06/06/2014 4:03 PM) General Present- Fatigue. Not Present- Appetite Loss, Chills, Fever, Night Sweats, Weight Gain and Weight Loss. HEENT Present- Seasonal Allergies and Wears glasses/contact lenses. Not Present- Earache, Hearing Loss, Hoarseness, Nose Bleed, Oral Ulcers, Ringing in the Ears, Sinus Pain, Sore Throat, Visual Disturbances and Yellow Eyes. Respiratory Present- Snoring. Not Present- Bloody sputum, Chronic Cough, Difficulty Breathing and Wheezing. Breast Not Present- Breast Mass, Breast Pain, Nipple Discharge and Skin Changes. Cardiovascular Present- Leg Cramps and Swelling of Extremities. Not Present- Chest Pain, Difficulty Breathing Lying Down, Palpitations, Rapid Heart Rate and Shortness of Breath. Gastrointestinal Present- Bloating, Constipation and Excessive gas. Not Present- Abdominal Pain, Bloody Stool, Change in Bowel Habits, Chronic diarrhea, Difficulty Swallowing, Gets full quickly at meals, Hemorrhoids, Indigestion, Nausea, Rectal Pain and Vomiting. Female Genitourinary Not Present- Frequency, Nocturia, Painful Urination, Pelvic Pain and Urgency. Musculoskeletal Present- Back Pain. Not Present- Joint Pain, Joint Stiffness, Muscle Pain, Muscle Weakness and Swelling of Extremities. Neurological Present- Numbness, Tingling and Trouble walking. Not Present- Decreased Memory, Fainting, Headaches, Seizures, Tremor and Weakness. Endocrine Present- Heat Intolerance. Not Present- Cold Intolerance, Excessive Hunger, Hair Changes, Hot flashes and New  Diabetes.   Vitals [name] CMA; 06/06/2014 4:05 PM) 06/06/2014 4:04 PM Weight: 255 lb Height: 63.5in Body Surface Area: 2.28 m Body Mass Index: 44.46 kg/m Temp.: 98.46F(Oral)  Pulse: 80 (Regular)  Resp.: 16 (Unlabored)  BP: 126/70 (Sitting, Left Arm, Standard)    Physical Exam (Addisson Frate B. Hassell Done MD; 06/06/2014 4:56 PM) Eldridge Dace unremarakable  Neck supple Chest clear Heart SR Abdomen Lapband port is readily palpable. Ext FROM    Assessment & Plan Rodman Key B. Hassell Done MD; 06/06/2014 4:57 PM) GASTRIC BANDING STATUS (V45.86  Z98.84)-Not functioning Plan:  Laparoscopic removal of lapband.

## 2014-08-07 NOTE — Anesthesia Preprocedure Evaluation (Addendum)
Anesthesia Evaluation  Patient identified by MRN, date of birth, ID band Patient awake    Reviewed: Allergy & Precautions, H&P , NPO status , Patient's Chart, lab work & pertinent test results  Airway Mallampati: III  TM Distance: >3 FB Neck ROM: Full    Dental no notable dental hx.    Pulmonary sleep apnea , former smoker,  Mild OSA breath sounds clear to auscultation  Pulmonary exam normal       Cardiovascular Rhythm:regular Rate:Normal     Neuro/Psych neuropathy negative psych ROS   GI/Hepatic negative GI ROS, Neg liver ROS, hiatal hernia,   Endo/Other  negative endocrine ROSMorbid obesity  Renal/GU negative Renal ROS  negative genitourinary   Musculoskeletal   Abdominal (+) + obese,   Peds  Hematology negative hematology ROS (+)   Anesthesia Other Findings   Reproductive/Obstetrics negative OB ROS                             Anesthesia Physical Anesthesia Plan  ASA: III  Anesthesia Plan: General   Post-op Pain Management:    Induction: Intravenous  Airway Management Planned: Oral ETT  Additional Equipment:   Intra-op Plan:   Post-operative Plan: Extubation in OR  Informed Consent: I have reviewed the patients History and Physical, chart, labs and discussed the procedure including the risks, benefits and alternatives for the proposed anesthesia with the patient or authorized representative who has indicated his/her understanding and acceptance.   Dental Advisory Given  Plan Discussed with: Surgeon  Anesthesia Plan Comments:         Anesthesia Quick Evaluation

## 2014-08-07 NOTE — Op Note (Signed)
Surgeon: Kaylyn Lim, MD, FACS  Asst:  Greer Pickerel, MD, FACS  Anes:  general  Procedure: Removal of Lapband  Diagnosis: lapband dysfunction  Complications: none  EBL:   minimal cc  Drains: none  Description of Procedure:  The patient was taken to OR 1 at [facility].  After anesthesia was administered and the patient was prepped a timeout was performed.  Access to the abdomen was achieved with a 5 mm Optiview through the left upper quadrant.  All 5 mm trocars except one 15 mm trocar was placed.  The [place] was placed, the lapband was cut and easily removed.  The pieces were extracted through the 15 mm port.  The lapband port was removed and all of the tubing was removed with it.  The mesh backing was removed as well.  The 15 mm port was closed with a 0 vicryl.  Port sites were injected with Exparel and closed with 4-0 vicryl and Liquiban.    The patient tolerated the procedure well and was taken to the PACU in stable condition.     Matt B. Hassell Done, [place], [facility], [place]

## 2014-08-07 NOTE — Anesthesia Procedure Notes (Signed)
Procedure Name: Intubation Date/Time: 08/07/2014 11:25 AM Performed by: Danley Danker L Patient Re-evaluated:Patient Re-evaluated prior to inductionPreoxygenation: Pre-oxygenation with 100% oxygen Intubation Type: IV induction Ventilation: Mask ventilation without difficulty and Oral airway inserted - appropriate to patient size Laryngoscope Size: Sabra Heck and 2 Grade View: Grade I Tube type: Oral Tube size: 7.5 mm Number of attempts: 3 Airway Equipment and Method: Stylet Placement Confirmation: ETT inserted through vocal cords under direct vision,  positive ETCO2 and breath sounds checked- equal and bilateral Secured at: 21 cm Tube secured with: Tape Dental Injury: Teeth and Oropharynx as per pre-operative assessment  Comments: Paramedic student Roderic Palau attempted intubation x2 without success.  3rd attempt successful as charted above

## 2014-08-08 LAB — BASIC METABOLIC PANEL
ANION GAP: 8 (ref 5–15)
BUN: 10 mg/dL (ref 6–20)
CALCIUM: 8.8 mg/dL — AB (ref 8.9–10.3)
CO2: 24 mmol/L (ref 22–32)
CREATININE: 0.36 mg/dL — AB (ref 0.44–1.00)
Chloride: 106 mmol/L (ref 101–111)
GFR calc Af Amer: >60 mL/min (ref >60)
GFR calc non Af Amer: >60 mL/min (ref >60)
Glucose, Bld: 125 mg/dL — ABNORMAL HIGH (ref 65–99)
Potassium: 4 mmol/L (ref 3.5–5.1)
Sodium: 138 mmol/L (ref 135–145)

## 2014-08-08 LAB — CBC
HCT: 41.7 % (ref 36.0–46.0)
HEMOGLOBIN: 13.8 g/dL (ref 12.0–15.0)
MCH: 29.7 pg (ref 26.0–34.0)
MCHC: 33.1 g/dL (ref 30.0–36.0)
MCV: 89.9 fL (ref 78.0–100.0)
PLATELETS: 224 10*3/uL (ref 150–400)
RBC: 4.64 MIL/uL (ref 3.87–5.11)
RDW: 12.9 % (ref 11.5–15.5)
WBC: 6 10*3/uL (ref 4.0–10.5)

## 2014-08-08 NOTE — Discharge Summary (Signed)
Reviewed discharge instructions with pt including upcoming appointments, medications, and precautions.  Pt verbalized understanding of all topics without questions.  Pt being d/c to home into care of granddaughter.

## 2014-08-08 NOTE — Discharge Summary (Signed)
Physician Discharge Summary  Patient ID: Miranda Keith MRN: [id number] DOB/AGE: 69/01/1946 69 y.o.  Admit date: 08/07/2014 Discharge date: 08/08/2014  Admission Diagnoses:  lapband dysfunction/didn't work  Discharge Diagnoses:  same  Active Problems:   Gastric band malfunction   Surgery:  Removal of lapband  Discharged Condition: improved  Hospital Course:   Had surgery.  Kept overnight and didn't have any problems with nausea.  Ready for discharge in the morning.  To resume Tramadol  Consults: none  Significant Diagnostic Studies: none    Discharge Exam: Blood pressure 111/62, pulse 84, temperature 98.2 F (36.8 C), temperature source Oral, resp. rate 16, height 5' 3.5" (1.613 m), weight 118.105 kg (260 lb 6 oz), SpO2 91 %. Incisions OK   Disposition: 06-Home-Health Care Svc  Discharge Instructions    Ambulate hourly while awake    Complete by:  As directed      Diet Carb Modified    Complete by:  As directed      Discharge instructions    Complete by:  As directed   Try to avoid simple carbs and foods with a high glycemic index            Medication List    TAKE these medications        cephALEXin 500 MG capsule  Commonly known as:  KEFLEX  Take 500 mg by mouth 3 (three) times daily. Started on 07/31/2014     cetirizine 10 MG tablet  Commonly known as:  ZYRTEC  Take 10 mg by mouth daily.     furosemide 20 MG tablet  Commonly known as:  LASIX  Take 20 mg by mouth daily as needed (for fluid).     Magnesium 400 MG Caps  Take 400 mg by mouth daily.     methocarbamol 500 MG tablet  Commonly known as:  ROBAXIN  Take 500 mg by mouth every 6 (six) hours as needed for muscle spasms.     multivitamin with minerals tablet  Take 1 tablet by mouth daily.     NON FORMULARY  Take 1 tablet by mouth daily. Mega red     potassium chloride 10 MEQ tablet  Commonly known as:  K-DUR  Take 10 mEq by mouth daily as needed. Only takes when taking lasix     traMADol  50 MG tablet  Commonly known as:  ULTRAM  Take 1-2 tablets (50-100 mg total) by mouth every 6 (six) hours as needed (mild pain).     Vitamin D3 5000 UNITS Caps  Take 1 capsule by mouth daily.           Follow-up Information    Follow up with Pedro Earls, MD.   Specialty:  General Surgery   Why:  As needed   Contact information:   [place] [address] [phone]       Signed: Pedro Earls 08/08/2014, 8:42 AM

## 2014-08-08 NOTE — Care Management Note (Signed)
Case Management Note  Patient Details  Name: Miranda Keith MRN: [id number] Date of Birth: 12/02/1945  Subjective/Objective:          Admitted s/p lap band removal          Action/Plan: Discharge planning  Expected Discharge Date:  08/08/14               Expected Discharge Plan:  Home/Self Care  In-House Referral:  NA  Discharge planning Services  CM Consult  Post Acute Care Choice:    Choice offered to:     DME Arranged:    DME Agency:     HH Arranged:    HH Agency:     Status of Service:  Completed, signed off  Medicare Important Message Given:  N/A - LOS <3 / Initial given by admissions Date Medicare IM Given:    Medicare IM give by:    Date Additional Medicare IM Given:    Additional Medicare Important Message give by:     If discussed at [place] of Stay Meetings, dates discussed:    Additional Comments:  Guadalupe Maple, RN 08/08/2014, 8:45 AM

## 2014-08-13 ENCOUNTER — Ambulatory Visit (INDEPENDENT_AMBULATORY_CARE_PROVIDER_SITE_OTHER): Payer: Medicare Other | Admitting: Cardiovascular Disease

## 2014-08-13 VITALS — BP 120/77 | HR 88 | Ht 63.5 in | Wt 257.0 lb

## 2014-08-13 DIAGNOSIS — I444 Left anterior fascicular block: Secondary | ICD-10-CM | POA: Diagnosis not present

## 2014-08-13 DIAGNOSIS — M48061 Spinal stenosis, lumbar region without neurogenic claudication: Secondary | ICD-10-CM

## 2014-08-13 DIAGNOSIS — Z136 Encounter for screening for cardiovascular disorders: Secondary | ICD-10-CM | POA: Diagnosis not present

## 2014-08-13 DIAGNOSIS — M79606 Pain in leg, unspecified: Secondary | ICD-10-CM

## 2014-08-13 DIAGNOSIS — M4806 Spinal stenosis, lumbar region: Secondary | ICD-10-CM

## 2014-08-13 NOTE — Patient Instructions (Signed)
Your physician recommends that you continue on your current medications as directed. Please refer to the Current Medication list given to you today. Your physician has requested that you have a lower extremity arterial exercise duplex with ABI's. During this test, exercise and ultrasound are used to evaluate arterial blood flow in the legs. Allow one hour for this exam. There are no restrictions or special instructions. Your follow up appointment will be based on your test result.

## 2014-08-13 NOTE — Progress Notes (Signed)
Patient ID: Miranda Keith, female   DOB: 08-02-1945, 69 y.o.   MRN: [id number]       CARDIOLOGY CONSULT NOTE  Patient ID: Miranda Keith MRN: [id number] DOB/AGE: Jul 06, 1945 69 y.o.  Admit date: (Not on file) Primary Physician Monico Blitz, MD  Reason for Consultation: circulatory problems  HPI: The patient is a 69 year old woman who presents for evaluation of presumed lower extremity circulatory problems. Her mother, [name], is also my patient. She was recently hospitalized for gastric band malfunction with removal.  She has a long history of back issues with multiple lumbar spinal surgeries, most recently in May 2015. 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   She sometimes experiences lower extremity pain relief with bending forward. If she sits for long periods of time her pain is worse. She experiences pain in both quadriceps, thighs, and legs. She denies chest pain. She seldom has shortness of breath.  While hospitalized she received heparin injections and said her back pain was alleviated and feels it was due to heparin, thus leading her to believe that her lower extreme pain is circulatory-related.  ECG performed in the office today demonstrates sinus rhythm with a left anterior fascicular block and poor R-wave progression.  No Known Allergies  Current Outpatient Prescriptions  Medication Sig Dispense Refill  . cetirizine (ZYRTEC) 10 MG tablet Take 10 mg by mouth daily.    . Cholecalciferol (VITAMIN D3) 5000 UNITS CAPS Take 1 capsule by mouth daily.    . furosemide (LASIX) 20 MG tablet Take 20 mg by mouth daily as needed (for fluid).     . Magnesium 400 MG CAPS Take 400 mg by mouth daily.     . methocarbamol (ROBAXIN) 500 MG tablet Take 500 mg by mouth every 6 (six) hours as needed for muscle spasms.    . Multiple Vitamins-Minerals (MULTIVITAMIN WITH MINERALS) tablet Take 1 tablet by  mouth daily.    . NON FORMULARY Take 1 tablet by mouth daily. Mega red    . potassium chloride (K-DUR) 10 MEQ tablet Take 10 mEq by mouth daily as needed. Only takes when taking lasix    . traMADol (ULTRAM) 50 MG tablet Take 1-2 tablets (50-100 mg total) by mouth every 6 (six) hours as needed (mild pain). 60 tablet 0   No current facility-administered medications for this visit.    Past Medical History  Diagnosis Date  . Joint pain   . Allergic rhinitis   . Arthritis   . Degenerative disc disease   . Obesity   . Fibromyalgia     Reported by pt on 08/27/09  . Neuropathy     hands  . Skin cancer   . Callus of foot     right foot - treatment on 07/31/2014 started on antibiotics   . History of hiatal hernia   . Sleep apnea     "mild sleep apnea" unable to tolerate c-pap    Past Surgical History  Procedure Laterality Date  . Neck fusion  2001, 2013  . Back surgery      lower back  . Total knee arthroplasty      right  . Laparoscopic gastric banding  12/03/09  . Total abdominal hysterectomy  1994  . Appendectomy    . Total knee arthroplasty Left 07/13/2012    Procedure: LEFT TOTAL KNEE ARTHROPLASTY;  Surgeon: Gearlean Alf, MD;  Location: [facility];  Service: Orthopedics;  Laterality: Left;    History   Social History  . Marital Status: Widowed    Spouse Name: N/A  . Number of Children: Y  . Years of Education: N/A   Occupational History  . retired from [employer]  . Smoking status: Former Smoker    Types: Cigarettes    Quit date: 02/23/1978  . Smokeless tobacco: Never Used     Comment: quit 1980s.  only smoked' rarely" - couldn't remember how many cigs x how many yrs.   . Alcohol Use: No     Comment: occasional   . Drug Use: No  . Sexual Activity: Not on file   Other Topics Concern  . Not on file   Social History Narrative   Lives with her mom.      No family history of premature CAD in 1st degree relatives.  Prior to  Admission medications   Medication Sig Start Date End Date Taking? Authorizing Provider  cetirizine (ZYRTEC) 10 MG tablet Take 10 mg by mouth daily.   Yes Historical Provider, MD  Cholecalciferol (VITAMIN D3) 5000 UNITS CAPS Take 1 capsule by mouth daily.   Yes Historical Provider, MD  furosemide (LASIX) 20 MG tablet Take 20 mg by mouth daily as needed (for fluid).  04/28/11  Yes Historical Provider, MD  Magnesium 400 MG CAPS Take 400 mg by mouth daily.    Yes Historical Provider, MD  methocarbamol (ROBAXIN) 500 MG tablet Take 500 mg by mouth every 6 (six) hours as needed for muscle spasms.   Yes Historical Provider, MD  Multiple Vitamins-Minerals (MULTIVITAMIN WITH MINERALS) tablet Take 1 tablet by mouth daily.   Yes Historical Provider, MD  NON FORMULARY Take 1 tablet by mouth daily. Mega red   Yes Historical Provider, MD  potassium chloride (K-DUR) 10 MEQ tablet Take 10 mEq by mouth daily as needed. Only takes when taking lasix 04/28/11  Yes Historical Provider, MD  traMADol (ULTRAM) 50 MG tablet Take 1-2 tablets (50-100 mg total) by mouth every 6 (six) hours as needed (mild pain). 07/14/12  Yes Arlee Muslim, PA-C     Review of systems complete and found to be negative unless listed above in HPI     Physical exam Blood pressure 120/77, pulse 88, height 5' 3.5" (1.613 m), weight 257 lb (116.574 kg). General: NAD Neck: No JVD, no thyromegaly or thyroid nodule.  Lungs: Clear to auscultation bilaterally with normal respiratory effort. CV: Nondisplaced PMI. Regular rate and rhythm, normal S1/S2, no S3/S4, no murmur.  Trace peripheral edema with bilateral venous varicosities.  No carotid bruit.  Normal pedal pulses.  Abdomen: Soft, nontender, obese, no distention.  Skin: Intact without lesions or rashes.  Neurologic: Alert and oriented x 3.  Psych: Normal affect. Extremities: No clubbing or cyanosis.  HEENT: Normal.   ECG: Most recent ECG reviewed.  Labs:   Lab Results  Component Value  Date   WBC 6.0 08/08/2014   HGB 13.8 08/08/2014   HCT 41.7 08/08/2014   MCV 89.9 08/08/2014   PLT 224 08/08/2014    Recent Labs Lab 08/08/14 0452  NA 138  K 4.0  CL 106  CO2 24  BUN 10  CREATININE 0.36*  CALCIUM 8.8*  GLUCOSE 125*   No results found for: CKTOTAL, CKMB, CKMBINDEX, TROPONINI No results found for: CHOL No results found for: HDL No results found for: LDLCALC No results found for: TRIG No results found for: CHOLHDL  No results found for: LDLDIRECT       Studies: No results found.   ASSESSMENT AND PLAN:  1. Lower extremity pain: It is quite possible her pain is due to lumbar spinal stenosis, given her previous history and positional alleviating factors. In order to be prudent, will obtain ABI's. If this is normal, would recommend follow up with her orthopedic surgeon.  Dispo: f/u to be determined after ABI's.    Signed: Kate Sable, M.D., F.A.C.C.  08/13/2014, 2:36 PM

## 2014-08-20 ENCOUNTER — Other Ambulatory Visit: Payer: Self-pay | Admitting: Cardiovascular Disease

## 2014-08-20 DIAGNOSIS — I739 Peripheral vascular disease, unspecified: Secondary | ICD-10-CM

## 2014-08-23 ENCOUNTER — Ambulatory Visit (INDEPENDENT_AMBULATORY_CARE_PROVIDER_SITE_OTHER): Payer: Medicare Other

## 2014-08-23 DIAGNOSIS — I739 Peripheral vascular disease, unspecified: Secondary | ICD-10-CM

## 2015-01-28 ENCOUNTER — Ambulatory Visit (INDEPENDENT_AMBULATORY_CARE_PROVIDER_SITE_OTHER): Payer: Medicare Other | Admitting: Neurology

## 2015-01-28 ENCOUNTER — Encounter: Payer: Self-pay | Admitting: Neurology

## 2015-01-28 VITALS — BP 131/86 | HR 85 | Ht 63.5 in | Wt 266.0 lb

## 2015-01-28 DIAGNOSIS — R269 Unspecified abnormalities of gait and mobility: Secondary | ICD-10-CM | POA: Diagnosis not present

## 2015-01-28 DIAGNOSIS — M5416 Radiculopathy, lumbar region: Secondary | ICD-10-CM | POA: Diagnosis not present

## 2015-01-28 MED ORDER — OXCARBAZEPINE 150 MG PO TABS: ORAL_TABLET | ORAL | Status: DC

## 2015-01-28 NOTE — Progress Notes (Signed)
PATIENT: Miranda Keith DOB: 10/04/1945  Chief Complaint  Patient presents with  . Peripheral Neuropathy    She has been having painful burning, tingling and numbness in her bilateral lower extremities.  Her right side is worse than left.  Her symptoms have been present since her first back surgery in 2001.  Says she gets the most relief with her TENS unit.  Feels her by mouth medications provide very limited improvement.     HISTORICAL  Miranda Keith is a 69 year old right-handed female, seen in refer by orthopedic surgeon Dr. Wylene Simmer and her primary care physician Dr. Monico Blitz for evaluation of bilateral lower extremity paresthesia.  She reported a history of cervical decompression, lumbar decompression surgery surgery twice by neurosurgeon Dr. Glenna Fellows. She had cervical decompression surgery because of neck pain gait difficulty, lumbar decompression surgery in 2012, due to spondylosis at L4-5, L5-S1, epidural lipoma L2-S1, she had L2-3, 3 4 laminotomy and foraminotomy for epidural lipomatosis, L4-5, L5-S1 laminectomy and discectomy, she had recurrent low back pain, radiating pain to bilateral lower extremity, right worse than left, she had second lumbar decompression surgery in May 2015, which did help her low back pain for a while,   Since September 2016, she began to have progressive worsening right low back pain, radiating pain to right leg, anterior thigh and, to right knee, top of right foot, then 2 right first toe, also noticed increased low back pain after bearing weight, she denies bowel and bladder incontinence,   She was treated with TENS unit, cause irritation of her skin, gabapentin and even low-dose Lyrica 25 mg 3 times a day, cause swelling of bilateral hands and lower extremity, which did help her lower extremity shooting pain.  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 cetirizine (ZYRTEC) 10 MG tablet Take 10 mg by mouth daily.    . Cholecalciferol (VITAMIN D3) 5000 UNITS CAPS Take 1 capsule by mouth daily.    . clonazePAM (KLONOPIN) 0.5 MG tablet     . furosemide (LASIX) 20 MG tablet Take 20 mg by mouth daily as needed (for fluid).     . Magnesium 400 MG CAPS Take 400 mg by mouth daily.     . methocarbamol (ROBAXIN) 500 MG tablet Take 500 mg by mouth every 6 (six) hours as needed for muscle spasms.    . Multiple Vitamins-Minerals (MULTIVITAMIN WITH MINERALS) tablet Take 1 tablet by mouth daily.    . NON FORMULARY Take 1 tablet by mouth daily. Mega red    . potassium chloride (K-DUR) 10 MEQ tablet Take 10 mEq by mouth daily as needed. Only takes when taking lasix    . pregabalin (LYRICA) 25 MG capsule Take 25 mg by mouth 3 (three) times daily.    . traMADol (ULTRAM) 50 MG tablet Take 1-2 tablets (50-100 mg total) by mouth every 6 (six) hours as needed (mild pain). 60 tablet 0     PAST MEDICAL HISTORY: Past Medical History  Diagnosis Date  . Joint pain   . Allergic rhinitis   . Arthritis   . Degenerative disc disease   . Obesity   .  Fibromyalgia     Reported by pt on 08/27/09  . Neuropathy ([place])     hands  . Skin cancer   . Callus of foot     right foot - treatment on 07/31/2014 started on antibiotics   . History of hiatal hernia   . Sleep apnea     "mild sleep apnea" unable to tolerate c-pap  . Peripheral neuropathy ([place])   . Back pain   . Neck pain     PAST SURGICAL HISTORY: Past Surgical History  Procedure Laterality Date  . Neck fusion  2001, 2013  . Back surgery      lower back x 2  . Total knee arthroplasty      right  . Laparoscopic  gastric banding  12/03/09  . Total abdominal hysterectomy  1994  . Appendectomy    . Total knee arthroplasty Left 07/13/2012    Procedure: LEFT TOTAL KNEE ARTHROPLASTY;  Surgeon: Gearlean Alf, MD;  Location: [facility];  Service: Orthopedics;  Laterality: Left;    FAMILY HISTORY: Family History  Problem Relation Age of Onset  . Allergies Mother   . Allergies Other     grandchildren  . Colon cancer Father   . Colon cancer Paternal Aunt   . Colon cancer Paternal Grandmother   . Stomach cancer Neg Hx   . Colon cancer Paternal Aunt   . Rheum arthritis Mother   . Heart disease Mother   . Throat cancer Father     SOCIAL HISTORY:  Social History   Social History  . Marital Status: Widowed    Spouse Name: N/A  . Number of Children: 1  . Years of Education: HS   Occupational History  . retired from [employer]  . Smoking status: Former Smoker    Types: Cigarettes    Quit date: 02/23/1978  . Smokeless tobacco: Never Used     Comment: quit 1980s.  only smoked' rarely" - couldn't remember how many cigs x how many yrs.   . Alcohol Use: No     Comment: occasional   . Drug Use: No  . Sexual Activity: Not on file   Other Topics Concern  . Not on file   Social History Narrative   Lives with her mom.    Right-handed.   No caffeine use.     PHYSICAL EXAM   Filed Vitals:   01/28/15 1316  BP: 131/86  Pulse: 85  Height: 5' 3.5" (1.613 m)  Weight: 266 lb (120.657 kg)    Not recorded      Body mass index is 46.37 kg/(m^2).  PHYSICAL EXAMNIATION:  Gen: NAD, conversant, well nourised, obese, well groomed                     Cardiovascular: Regular rate rhythm, no peripheral edema, warm, nontender. Eyes: Conjunctivae clear without exudates or hemorrhage Neck: Supple, no carotid bruise. Pulmonary: Clear to auscultation bilaterally   NEUROLOGICAL EXAM:  MENTAL STATUS: Speech:    Speech is normal; fluent and spontaneous with normal  comprehension.  Cognition:     Orientation to time, place and person     Normal recent and remote memory     Normal Attention span and concentration     Normal Language, naming, repeating,spontaneous speech     Fund of knowledge   CRANIAL NERVES: CN II: Visual fields are full to confrontation. Fundoscopic exam is normal with sharp discs and no  vascular changes. Pupils are round equal and briskly reactive to light. CN III, IV, VI: extraocular movement are normal. No ptosis. CN V: Facial sensation is intact to pinprick in all 3 divisions bilaterally. Corneal responses are intact.  CN VII: Face is symmetric with normal eye closure and smile. CN VIII: Hearing is normal to rubbing fingers CN IX, X: Palate elevates symmetrically. Phonation is normal. CN XI: Head turning and shoulder shrug are intact CN XII: Tongue is midline with normal movements and no atrophy.  MOTOR: There is no pronator drift of out-stretched arms. Muscle bulk and tone are normal. She has mild to moderate bilateral toe flexion extension, bilateral ankle dorsiflexion weakness, she has bilateral hammertoes   REFLEXES: Reflexes are 2+ and symmetric at the biceps, triceps, absent at knees, and ankles. Plantar responses are flexor.  SENSORY: Length dependent decreased light touch pinprick and vibratory sensation to midshin level  COORDINATION: Rapid alternating movements and fine finger movements are intact. There is no dysmetria on finger-to-nose and heel-knee-shin.    GAIT/STANCE: Need to push up to get up from seated position, cautious, unsteady gait, mild bilateral foot drop, could not stand on heels oh tiptoe  DIAGNOSTIC DATA (LABS, IMAGING, TESTING) - I reviewed patient records, labs, notes, testing and imaging myself where available.   ASSESSMENT AND PLAN  Miranda Keith is a 69 y.o. female   Lumbar radiculopathy Gait abnormality  Continue evaluation with MRI of lumbar  Continue Lyrica 25 mg 3 times a day,  could not tolerate higher dose, cause feet and hand swelling does help her neuropathic pain   add on Trileptal 150 mg twice a day  EMG nerve conduction study     Marcial Pacas, M.D. Ph.D.  [facility] [address], [place], [place] [address] Ph: [phone] Fax: [phone]  CC: Wylene Simmer, MD primary care physician Dr. Monico Blitz

## 2015-02-08 ENCOUNTER — Inpatient Hospital Stay: Admission: RE | Admit: 2015-02-08 | Payer: Medicare Other | Source: Ambulatory Visit

## 2015-02-27 ENCOUNTER — Ambulatory Visit
Admission: RE | Admit: 2015-02-27 | Discharge: 2015-02-27 | Disposition: A | Discharge status: Home / Self Care | Payer: Medicare Other | Source: Ambulatory Visit | Attending: Neurology | Admitting: Neurology

## 2015-02-27 DIAGNOSIS — M5416 Radiculopathy, lumbar region: Secondary | ICD-10-CM

## 2015-03-05 ENCOUNTER — Telehealth: Payer: Self-pay | Admitting: Neurology

## 2015-03-05 NOTE — Telephone Encounter (Signed)
I have called patient MRI lumbar report  IMPRESSION: this MRI of the lumbar spine without contrast shows the following:  1. PLIF from L3-S1. The L3-L4 level surgery has occurred since the prior MRI dated 03/19/2013. The posterior fusion appears solid and there does not appear to be any nerve root compression at any of these 3 levels 2. There is a small right paramedian disc protrusion at T12-L1 that does not lead to nerve root impingement. 3. There is left paramedian disc extrusion with caudal migration at L1-L2 causing mild spinal stenosis and severe left lateral recess stenosis that could lead to left L2 nerve root compression..  4. There is mild spinal stenosis at L2-L3 due to degenerative changes. There is severe left and moderate right foraminal narrowing. The extent of foraminal narrowing to the left has increased compared to the prior MRI and there is potential left L2 nerve root compression.

## 2015-03-18 ENCOUNTER — Ambulatory Visit (INDEPENDENT_AMBULATORY_CARE_PROVIDER_SITE_OTHER): Payer: Medicare Other | Admitting: Neurology

## 2015-03-18 DIAGNOSIS — R202 Paresthesia of skin: Secondary | ICD-10-CM

## 2015-03-18 DIAGNOSIS — M5416 Radiculopathy, lumbar region: Secondary | ICD-10-CM

## 2015-03-18 DIAGNOSIS — M48061 Spinal stenosis, lumbar region without neurogenic claudication: Secondary | ICD-10-CM

## 2015-03-18 DIAGNOSIS — R269 Unspecified abnormalities of gait and mobility: Secondary | ICD-10-CM | POA: Diagnosis not present

## 2015-03-18 DIAGNOSIS — M4806 Spinal stenosis, lumbar region: Secondary | ICD-10-CM

## 2015-03-19 NOTE — Procedures (Signed)
   NCS (NERVE CONDUCTION STUDY) WITH EMG (ELECTROMYOGRAPHY) REPORT   STUDY DATE: March 19 2015 PATIENT NAME: Miranda Keith DOB: 06-08-1945 MRN: [id number]    TECHNOLOGIST: Laretta Alstrom ELECTROMYOGRAPHER: Marcial Pacas M.D.  CLINICAL INFORMATION:  70 years old female, with history of lumbar decompression surgery, presenting with progressive bilateral lower extremity paresthesia, right side low back pain, radiating pain to right lower extremity.  FINDINGS: NERVE CONDUCTION STUDY: Bilateral peroneal, sural sensory responses were absent. Bilateral peroneal to EDB, tibial motor responses showed moderate to severely decreased C map amplitude, with normal distal latency, conduction velocity. Bilateral tibial H reflexes were absent.  NEEDLE ELECTROMYOGRAPHY: Selected needle examinations were performed at bilateral lower extremities, bilateral lumbar sacral paraspinal muscles.  Right tibialis anterior, tibialis posterior, peroneal longus, medial gastrocnemius: Increased insertional activity, 2-3 plus spontaneous activity, enlarged complex motor unit potential with decreased recruitment patterns.  Left tibialis anterior, peroneal longus, medial gastrocnemius: Increased insertional activity, 1-2 plus spontaneous activity, enlarged complex motor unit potential with decreased recruitment patterns.  Bilateral vastus lateralis: normal insertion activity, no spontaneous activity, mildly enlarged motor unit potential, with decreased recruitment patterns.  Right gluteus medius: Normal insertion activity, no spontaneous activity, mildly enlarged mixed with normal morphology motor unit potential, with mildly decreased recruitment patterns.  She has well-healed lower lumbar midline scars, there was increasing insertion activity, 1 plus spontaneous activity at bilateral L4, L5, no spontaneous activity noticed at bilateral S1.  IMPRESSION:  This is an abnormal study. There is electrodiagnostic evidence of  length dependent mild axonal sensorimotor polyneuropathy. There is also evidence of bilateral lumbar sacral radiculopathies, mainly involving bilateral L5, S1 myotomes, right worse than left.   INTERPRETING PHYSICIAN:   Marcial Pacas M.D. Ph.D. [facility] [address], [place], [place] [address] [phone]

## 2015-03-19 NOTE — Progress Notes (Signed)
PATIENT: Miranda Keith DOB: Sep 11, 1945  No chief complaint on file.    HISTORICAL  Miranda Keith is a 70 year old right-handed female, seen in refer by orthopedic surgeon Dr. Wylene Simmer and her primary care physician Dr. Monico Blitz for evaluation of bilateral lower extremity paresthesia.  She reported a history of cervical decompression, lumbar decompression surgery surgery twice by neurosurgeon Dr. Glenna Fellows. She had cervical decompression surgery because of neck pain gait difficulty, lumbar decompression surgery in 2012, due to spondylosis at L4-5, L5-S1, epidural lipoma L2-S1, she had L2-3, 3 4 laminotomy and foraminotomy for epidural lipomatosis, L4-5, L5-S1 laminectomy and discectomy, she had recurrent low back pain, radiating pain to bilateral lower extremity, right worse than left, she had second lumbar decompression surgery in May 2015, which did help her low back pain for a while,   Since September 2016, she began to have progressive worsening right low back pain, radiating pain to right leg, anterior thigh and, to right knee, top of right foot, then 2 right first toe, also noticed increased low back pain after bearing weight, she denies bowel and bladder incontinence,   She was treated with TENS unit, cause irritation of her skin, gabapentin and even low-dose Lyrica 25 mg 3 times a day, cause swelling of bilateral hands and lower extremity, which did help her lower extremity shooting pain.  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 She does has bilateral distal leg weakness,  We have personally reviewed MRI of lumbar spine January 2017:  PLIF from L3-S1. The L3-L4 level surgery has occurred since the prior MRI dated 03/19/2013. The posterior  fusion appears solid and there does not appear to be any nerve root compression at any of these 3 levels. There is a small right paramedian disc protrusion at T12-L1 that does not lead to nerve root impingement. There is left paramedian disc extrusion with caudal migration at L1-L2 causing mild spinal stenosis and severe left lateral recess stenosis that could lead to left L2 nerve root compression. There is mild spinal stenosis at L2-L3 due to degenerative changes. There is severe left and moderate right foraminal narrowing. The extent of foraminal narrowing to the left has increased compared to the prior MRI and there is potential left L2 nerve root compression.  She continue complains of gait difficulty, has been followed up by neurosurgeon Dr. Glenna Fellows. She has run out of Lyrica, noticed increased bilateral lower extremity paresthesia, burning pain, she is also taking tramadol as needed  REVIEW OF SYSTEMS: Full 14 system review of systems performed and notable only for swelling in legs, feeling hot, joint pain, cramps, aching muscles, allergy, numbness, weakness, insomnia, insomnia, not enough sleep, decreased energy, disinteresting activities  ALLERGIES: No Known Allergies  HOME MEDICATIONS: Current Outpatient Prescriptions  Medication Sig Dispense Refill  . cetirizine (ZYRTEC) 10 MG tablet Take 10 mg by mouth daily.    . Cholecalciferol (VITAMIN D3) 5000 UNITS CAPS Take 1 capsule by mouth daily.    . clonazePAM (KLONOPIN) 0.5 MG tablet     . furosemide (LASIX) 20 MG tablet Take 20 mg by mouth daily as needed (for fluid).     . Magnesium 400 MG CAPS Take 400 mg by mouth daily.     . methocarbamol (ROBAXIN) 500 MG tablet  Take 500 mg by mouth every 6 (six) hours as needed for muscle spasms.    . Multiple Vitamins-Minerals (MULTIVITAMIN WITH MINERALS) tablet Take 1 tablet by mouth daily.    . NON FORMULARY Take 1 tablet by mouth daily. Mega red    . potassium chloride (K-DUR) 10 MEQ tablet  Take 10 mEq by mouth daily as needed. Only takes when taking lasix    . pregabalin (LYRICA) 25 MG capsule Take 25 mg by mouth 3 (three) times daily.    . traMADol (ULTRAM) 50 MG tablet Take 1-2 tablets (50-100 mg total) by mouth every 6 (six) hours as needed (mild pain). 60 tablet 0     PAST MEDICAL HISTORY: Past Medical History  Diagnosis Date  . Joint pain   . Allergic rhinitis   . Arthritis   . Degenerative disc disease   . Obesity   . Fibromyalgia     Reported by pt on 08/27/09  . Neuropathy ([place])     hands  . Skin cancer   . Callus of foot     right foot - treatment on 07/31/2014 started on antibiotics   . History of hiatal hernia   . Sleep apnea     "mild sleep apnea" unable to tolerate c-pap  . Peripheral neuropathy ([place])   . Back pain   . Neck pain     PAST SURGICAL HISTORY: Past Surgical History  Procedure Laterality Date  . Neck fusion  2001, 2013  . Back surgery      lower back x 2  . Total knee arthroplasty      right  . Laparoscopic gastric banding  12/03/09  . Total abdominal hysterectomy  1994  . Appendectomy    . Total knee arthroplasty Left 07/13/2012    Procedure: LEFT TOTAL KNEE ARTHROPLASTY;  Surgeon: Gearlean Alf, MD;  Location: [facility];  Service: Orthopedics;  Laterality: Left;    FAMILY HISTORY: Family History  Problem Relation Age of Onset  . Allergies Mother   . Allergies Other     grandchildren  . Colon cancer Father   . Colon cancer Paternal Aunt   . Colon cancer Paternal Grandmother   . Stomach cancer Neg Hx   . Colon cancer Paternal Aunt   . Rheum arthritis Mother   . Heart disease Mother   . Throat cancer Father     SOCIAL HISTORY:  Social History   Social History  . Marital Status: Widowed    Spouse Name: N/A  . Number of Children: 1  . Years of Education: HS   Occupational History  . retired from [employer]  . Smoking status: Former Smoker    Types: Cigarettes    Quit date:  02/23/1978  . Smokeless tobacco: Never Used     Comment: quit 1980s.  only smoked' rarely" - couldn't remember how many cigs x how many yrs.   . Alcohol Use: No     Comment: occasional   . Drug Use: No  . Sexual Activity: Not on file   Other Topics Concern  . Not on file   Social History Narrative   Lives with her mom.    Right-handed.   No caffeine use.     PHYSICAL EXAM   There were no vitals filed for this visit.  Not recorded      There is no weight on file to calculate BMI.  PHYSICAL EXAMNIATION:  Gen: NAD, conversant, well  nourised, obese, well groomed                     Cardiovascular: Regular rate rhythm, no peripheral edema, warm, nontender. Eyes: Conjunctivae clear without exudates or hemorrhage Neck: Supple, no carotid bruise. Pulmonary: Clear to auscultation bilaterally   NEUROLOGICAL EXAM:  MENTAL STATUS: Speech:    Speech is normal; fluent and spontaneous with normal comprehension.  Cognition:     Orientation to time, place and person     Normal recent and remote memory     Normal Attention span and concentration     Normal Language, naming, repeating,spontaneous speech     Fund of knowledge   CRANIAL NERVES: CN II: Visual fields are full to confrontation. Fundoscopic exam is normal with sharp discs and no vascular changes. Pupils are round equal and briskly reactive to light. CN III, IV, VI: extraocular movement are normal. No ptosis. CN V: Facial sensation is intact to pinprick in all 3 divisions bilaterally. Corneal responses are intact.  CN VII: Face is symmetric with normal eye closure and smile. CN VIII: Hearing is normal to rubbing fingers CN IX, X: Palate elevates symmetrically. Phonation is normal. CN XI: Head turning and shoulder shrug are intact CN XII: Tongue is midline with normal movements and no atrophy.  MOTOR: There is no pronator drift of out-stretched arms. Muscle bulk and tone are normal. She has mild to moderate bilateral  toe flexion extension, bilateral ankle dorsiflexion weakness, she has bilateral hammertoes   REFLEXES: Reflexes are 2+ and symmetric at the biceps, triceps, absent at knees, and ankles. Plantar responses are flexor.  SENSORY: Length dependent decreased light touch pinprick and vibratory sensation to midshin level  COORDINATION: Rapid alternating movements and fine finger movements are intact. There is no dysmetria on finger-to-nose and heel-knee-shin.    GAIT/STANCE: Need to push up to get up from seated position, cautious, unsteady gait, mild bilateral foot drop, could not stand on heels oh tiptoe  DIAGNOSTIC DATA (LABS, IMAGING, TESTING) - I reviewed patient records, labs, notes, testing and imaging myself where available.   ASSESSMENT AND PLAN  ZOEYA STYERS is a 70 y.o. female   Lumbar radiculopathy  Repeat MRI of the brain showed worsening lumbar stenosis at L2-3, a level above previous decompression, and moderate to severe bilateral foraminal stenosis  Gait abnormality Bilateral lower extremity paresthesia  continue  Trileptal 150 mg twice a day  Tramadol as needed     Miranda Keith, M.D. Ph.D.  [facility] [address], [place], [place] [address] Ph: [phone] Fax: [phone]  CC: Wylene Simmer, MD primary care physician Dr. Monico Blitz

## 2015-06-03 ENCOUNTER — Encounter: Payer: Self-pay | Admitting: Gastroenterology

## 2015-07-25 ENCOUNTER — Other Ambulatory Visit: Payer: Self-pay | Admitting: *Deleted

## 2015-07-25 DIAGNOSIS — M79606 Pain in leg, unspecified: Secondary | ICD-10-CM

## 2015-07-30 ENCOUNTER — Ambulatory Visit (AMBULATORY_SURGERY_CENTER): Payer: Self-pay

## 2015-07-30 VITALS — Ht 63.5 in | Wt 266.4 lb

## 2015-07-30 DIAGNOSIS — Z860100 Personal history of colon polyps, unspecified: Secondary | ICD-10-CM

## 2015-07-30 NOTE — Progress Notes (Signed)
No allergies to eggs or soy No past problems with anesthesia No diet meds No home oxygen  No internet 

## 2015-08-08 ENCOUNTER — Encounter: Payer: Self-pay | Admitting: Gastroenterology

## 2015-08-08 ENCOUNTER — Ambulatory Visit (AMBULATORY_SURGERY_CENTER): Payer: Medicare Other | Admitting: Gastroenterology

## 2015-08-08 VITALS — BP 119/65 | HR 82 | Temp 97.8°F | Resp 9 | Ht 63.0 in | Wt 266.0 lb

## 2015-08-08 DIAGNOSIS — D122 Benign neoplasm of ascending colon: Secondary | ICD-10-CM | POA: Diagnosis not present

## 2015-08-08 DIAGNOSIS — Z86010 Personal history of colonic polyps: Secondary | ICD-10-CM

## 2015-08-08 DIAGNOSIS — D123 Benign neoplasm of transverse colon: Secondary | ICD-10-CM

## 2015-08-08 MED ORDER — SODIUM CHLORIDE 0.9 % IV SOLN: 500.0000 mL | INTRAVENOUS | Status: DC

## 2015-08-08 NOTE — Progress Notes (Signed)
Called to room to assist during endoscopic procedure.  Patient ID and intended procedure confirmed with present staff. Received instructions for my participation in the procedure from the performing physician.  

## 2015-08-08 NOTE — Op Note (Signed)
[facility] Patient Name: Miranda Keith Procedure Date: 08/08/2015 1:44 PM MRN: [id number] Endoscopist: Mallie Mussel L. Loletha Carrow , MD Age: 70 Referring MD:  Date of Birth: August 23, 1945 Gender: Female Procedure:                Colonoscopy Indications:              Surveillance: Personal history of adenomatous                            polyps on last colonoscopy > 3 years ago, Family                            history of colon cancer in a first-degree relative Medicines:                Monitored Anesthesia Care Procedure:                Pre-Anesthesia Assessment:                           - Prior to the procedure, a History and Physical                            was performed, and patient medications and                            allergies were reviewed. The patient's tolerance of                            previous anesthesia was also reviewed. The risks                            and benefits of the procedure and the sedation                            options and risks were discussed with the patient.                            All questions were answered, and informed consent                            was obtained. Prior Anticoagulants: The patient has                            taken no previous anticoagulant or antiplatelet                            agents. ASA Grade Assessment: II - A patient with                            mild systemic disease. After reviewing the risks                            and benefits, the patient was deemed in  satisfactory condition to undergo the procedure.                           After obtaining informed consent, the colonoscope                            was passed under direct vision. Throughout the                            procedure, the patient's blood pressure, pulse, and                            oxygen saturations were monitored continuously. The                            Model CF-HQ190L [id number]) scope was  introduced                            through the anus and advanced to the the cecum,                            identified by appendiceal orifice and ileocecal                            valve. The colonoscopy was performed without                            difficulty. The patient tolerated the procedure                            well. The quality of the bowel preparation was                            good. The ileocecal valve, appendiceal orifice, and                            rectum were photographed. The bowel preparation                            used was Miralax. Scope In: 1:59:38 PM Scope Out: 2:17:35 PM Scope Withdrawal Time: 0 hours 12 minutes 49 seconds  Total Procedure Duration: 0 hours 17 minutes 57 seconds  Findings:                 The perianal and digital rectal examinations were                            normal.                           An area of melanosis was found in the entire colon.                           A 8 mm polyp was found in the proximal ascending  colon. The polyp was semi-pedunculated. The polyp                            was removed with a hot snare. Resection and                            retrieval were complete.                           Two sessile polyps were found in the proximal                            transverse colon and proximal ascending colon. The                            polyps were 4 mm in size. These polyps were removed                            with a cold snare. Resection and retrieval were                            complete.                           The exam was otherwise without abnormality on                            direct and retroflexion views. Complications:            No immediate complications. Estimated Blood Loss:     Estimated blood loss: none. Impression:               - Melanosis in the colon.                           - One 8 mm polyp in the proximal ascending colon,                             removed with a hot snare. Resected and retrieved.                           - Two 4 mm polyps in the proximal transverse colon                            and in the proximal ascending colon, removed with a                            cold snare. Resected and retrieved.                           - The examination was otherwise normal on direct                            and retroflexion views. Recommendation:           -  Patient has a contact number available for                            emergencies. The signs and symptoms of potential                            delayed complications were discussed with the                            patient. Return to normal activities tomorrow.                            Written discharge instructions were provided to the                            patient.                           - Resume previous diet.                           - No aspirin, ibuprofen, naproxen, or other                            non-steroidal anti-inflammatory drugs for 5 days                            after polyp removal.                           - Await pathology results.                           - Repeat colonoscopy is recommended for                            surveillance. The colonoscopy date will be                            determined after pathology results from today's                            exam become available for review. Miranda Keith L. Loletha Carrow, MD 08/08/2015 2:25:44 PM This report has been signed electronically.

## 2015-08-08 NOTE — Patient Instructions (Signed)
Discharge instructions given. Handout on polyps. Resume previous medications. No aspirin,ibuprofen,naproxen,or other non-steroidal anti-inflammatory drugs for 5 days. YOU HAD AN ENDOSCOPIC PROCEDURE TODAY AT [facility]:   Refer to the procedure report that was given to you for any specific questions about what was found during the examination.  If the procedure report does not answer your questions, please call your gastroenterologist to clarify.  If you requested that your care partner not be given the details of your procedure findings, then the procedure report has been included in a sealed envelope for you to review at your convenience later.  YOU SHOULD EXPECT: Some feelings of bloating in the abdomen. Passage of more gas than usual.  Walking can help get rid of the air that was put into your GI tract during the procedure and reduce the bloating. If you had a lower endoscopy (such as a colonoscopy or flexible sigmoidoscopy) you may notice spotting of blood in your stool or on the toilet paper. If you underwent a bowel prep for your procedure, you may not have a normal bowel movement for a few days.  Please Note:  You might notice some irritation and congestion in your nose or some drainage.  This is from the oxygen used during your procedure.  There is no need for concern and it should clear up in a day or so.  SYMPTOMS TO REPORT IMMEDIATELY:   Following lower endoscopy (colonoscopy or flexible sigmoidoscopy):  Excessive amounts of blood in the stool  Significant tenderness or worsening of abdominal pains  Swelling of the abdomen that is new, acute  Fever of 100F or higher   For urgent or emergent issues, a gastroenterologist can be reached at any hour by calling [phone].   DIET: Your first meal following the procedure should be a small meal and then it is ok to progress to your normal diet. Heavy or fried foods are harder to digest and may make you feel nauseous  or bloated.  Likewise, meals heavy in dairy and vegetables can increase bloating.  Drink plenty of fluids but you should avoid alcoholic beverages for 24 hours.  ACTIVITY:  You should plan to take it easy for the rest of today and you should NOT DRIVE or use heavy machinery until tomorrow (because of the sedation medicines used during the test).    FOLLOW UP: Our staff will call the number listed on your records the next business day following your procedure to check on you and address any questions or concerns that you may have regarding the information given to you following your procedure. If we do not reach you, we will leave a message.  However, if you are feeling well and you are not experiencing any problems, there is no need to return our call.  We will assume that you have returned to your regular daily activities without incident.  If any biopsies were taken you will be contacted by phone or by letter within the next 1-3 weeks.  Please call us at [phone] if you have not heard about the biopsies in 3 weeks.    SIGNATURES/CONFIDENTIALITY: You and/or your care partner have signed paperwork which will be entered into your electronic medical record.  These signatures attest to the fact that that the information above on your After Visit Summary has been reviewed and is understood.  Full responsibility of the confidentiality of this discharge information lies with you and/or your care-partner.

## 2015-08-08 NOTE — Progress Notes (Signed)
Patient awakening,vss,report to rn 

## 2015-08-09 ENCOUNTER — Telehealth: Payer: Self-pay

## 2015-08-09 NOTE — Telephone Encounter (Signed)
  Follow up Call-  Call back number 08/08/2015  Post procedure Call Back phone  # [phone]  Permission to leave phone message Yes     Patient questions:  Do you have a fever, pain , or abdominal swelling? No. Pain Score  0 *  Have you tolerated food without any problems? Yes.    Have you been able to return to your normal activities? Yes.    Do you have any questions about your discharge instructions: Diet   No. Medications  No. Follow up visit  No.  Do you have questions or concerns about your Care? No.  Actions: * If pain score is 4 or above: No action needed, pain <4.

## 2015-08-12 ENCOUNTER — Encounter: Payer: Self-pay | Admitting: Surgery

## 2015-08-14 ENCOUNTER — Encounter: Payer: Self-pay | Admitting: Gastroenterology

## 2015-08-19 ENCOUNTER — Ambulatory Visit (INDEPENDENT_AMBULATORY_CARE_PROVIDER_SITE_OTHER): Payer: Medicare Other | Admitting: Surgery

## 2015-08-19 ENCOUNTER — Ambulatory Visit (HOSPITAL_COMMUNITY)
Admission: RE | Admit: 2015-08-19 | Discharge: 2015-08-19 | Disposition: A | Discharge status: Home / Self Care | Payer: Medicare Other | Source: Ambulatory Visit | Attending: Surgery | Admitting: Surgery

## 2015-08-19 ENCOUNTER — Encounter: Payer: Self-pay | Admitting: Surgery

## 2015-08-19 VITALS — BP 116/80 | HR 80 | Temp 97.4°F | Resp 22 | Ht 63.5 in | Wt 265.0 lb

## 2015-08-19 DIAGNOSIS — M25562 Pain in left knee: Secondary | ICD-10-CM

## 2015-08-19 DIAGNOSIS — M79606 Pain in leg, unspecified: Secondary | ICD-10-CM | POA: Diagnosis not present

## 2015-08-19 DIAGNOSIS — R0989 Other specified symptoms and signs involving the circulatory and respiratory systems: Secondary | ICD-10-CM | POA: Diagnosis present

## 2015-08-19 DIAGNOSIS — M25561 Pain in right knee: Secondary | ICD-10-CM | POA: Diagnosis not present

## 2015-08-19 NOTE — Progress Notes (Signed)
Referring Physician: Monico Blitz, MD  Patient name: Miranda Keith MRN: [id number] DOB: November 16, 1945 Sex: female  REASON FOR CONSULT: Right greater than left LE pain, numbness from the knees down.  HPI: Miranda Keith is a 70 y.o. female who states she has had right greater than left lower extremity pain since her first spinal surgery.   She had cervical spine surgery in 2001.  Followed by multiple lumbar surgeries to include fusions and discectomy.  She can walk with a straight cain, just slowly.  Sitting and standing cause her discomfort and numbness in both LE.   She reports no history of ulcers or skin changes.   Other medical problems include :  She denise DM, HTN, and hypercholestrolemia.  She has no history of CAD or COPD.    Past Medical History  Diagnosis Date  . Joint pain   . Allergic rhinitis   . Arthritis   . Degenerative disc disease   . Obesity   . Fibromyalgia     Reported by pt on 08/27/09  . Neuropathy ([place])     hands  . Skin cancer   . Callus of foot     right foot - treatment on 07/31/2014 started on antibiotics   . History of hiatal hernia   . Sleep apnea     "mild sleep apnea" unable to tolerate c-pap  . Peripheral neuropathy ([place])   . Back pain   . Neck pain    Past Surgical History  Procedure Laterality Date  . Neck fusion  2001, 2013  . Back surgery      lower back x 2  . Total knee arthroplasty      right  . Laparoscopic gastric banding  12/03/09    removed  . Total abdominal hysterectomy  1994  . Appendectomy    . Total knee arthroplasty Left 07/13/2012    Procedure: LEFT TOTAL KNEE ARTHROPLASTY;  Surgeon: Gearlean Alf, MD;  Location: [facility];  Service: Orthopedics;  Laterality: Left;  . Rotator cuff repair      left    Family History  Problem Relation Age of Onset  . Allergies Mother   . Allergies Other     grandchildren  . Colon cancer Father   . Colon cancer Paternal Aunt   . Colon cancer Paternal Grandmother   . Stomach cancer Neg Hx    . Colon cancer Paternal Aunt   . Rheum arthritis Mother   . Heart disease Mother   . Throat cancer Father     SOCIAL HISTORY: Social History   Social History  . Marital Status: Widowed    Spouse Name: N/A  . Number of Children: 1  . Years of Education: HS   Occupational History  . retired from [employer]  . Smoking status: Former Smoker    Types: Cigarettes    Quit date: 02/23/1978  . Smokeless tobacco: Never Used     Comment: quit 1980s.  only smoked' rarely" - couldn't remember how many cigs x how many yrs.   . Alcohol Use: 0.0 oz/week    0 Standard drinks or equivalent per week     Comment: very seldom  . Drug Use: No  . Sexual Activity: Not on file   Other Topics Concern  . Not on file   Social History Narrative   Lives with her mom.    Right-handed.   No caffeine use.  No Known Allergies  Current Outpatient Prescriptions  Medication Sig Dispense Refill  . cetirizine (ZYRTEC) 10 MG tablet Take 10 mg by mouth daily.    . Cholecalciferol (VITAMIN D3) 5000 UNITS CAPS Take 1 capsule by mouth daily.    . furosemide (LASIX) 20 MG tablet Take 20 mg by mouth daily as needed (for fluid).     . Magnesium 400 MG CAPS Take 400 mg by mouth daily.     . methocarbamol (ROBAXIN) 500 MG tablet Take 500 mg by mouth every 6 (six) hours as needed for muscle spasms.    . Multiple Vitamins-Minerals (MULTIVITAMIN WITH MINERALS) tablet Take 1 tablet by mouth daily.    . NON FORMULARY Take 1 tablet by mouth daily. Mega red    . potassium chloride (K-DUR) 10 MEQ tablet Take 10 mEq by mouth daily as needed. Only takes when taking lasix    . traMADol (ULTRAM) 50 MG tablet Take 1-2 tablets (50-100 mg total) by mouth every 6 (six) hours as needed (mild pain). 60 tablet 0  . clonazePAM (KLONOPIN) 0.5 MG tablet Reported on 08/19/2015    . pregabalin (LYRICA) 25 MG capsule Take 25 mg by mouth 3 (three) times daily. Reported on 08/19/2015     No current  facility-administered medications for this visit.    ROS:   General:  No weight loss, Fever, chills  HEENT: No recent headaches, no nasal bleeding, no visual changes, no sore throat  Neurologic: No dizziness, blackouts, seizures. No recent symptoms of stroke or mini- stroke. No recent episodes of slurred speech, or temporary blindness.  Cardiac: No recent episodes of chest pain/pressure, no shortness of breath at rest.  No shortness of breath with exertion.  Denies history of atrial fibrillation or irregular heartbeat  Vascular: No history of rest pain in feet.  No history of claudication.  No history of non-healing ulcer, No history of DVT   Pulmonary: No home oxygen, no productive cough, no hemoptysis,  No asthma or wheezing  Musculoskeletal:  [x ] Arthritis, [x ] Low back pain,  x[ ]  Joint pain  Hematologic:No history of hypercoagulable state.  No history of easy bleeding.  No history of anemia  Gastrointestinal: No hematochezia or melena,  No gastroesophageal reflux, no trouble swallowing  Urinary: [ ]  chronic Kidney disease, [ ]  on HD - [ ]  MWF or [ ]  TTHS, [ ]  Burning with urination, [ ]  Frequent urination, [ ]  Difficulty urinating;   Skin: No rashes  Psychological: No history of anxiety,  No history of depression   Physical Examination  Filed Vitals:   08/19/15 1435  BP: 116/80  Pulse: 80  Temp: 97.4 F (36.3 C)  TempSrc: Oral  Resp: 22  Height: 5' 3.5" (1.613 m)  Weight: 265 lb (120.203 kg)  SpO2: 93%    Body mass index is 46.2 kg/(m^2).  General:  Alert and oriented, no acute distress HEENT: Normal Neck: No bruit or JVD Pulmonary: Clear to auscultation bilaterally Cardiac: Regular Rate and Rhythm without murmur Abdomen: Soft, non-tender, non-distended, no mass, no scars Skin: No rash Extremity Pulses:  2+ radial, brachial, femoral, dorsalis pedis pulses bilaterally Musculoskeletal: No deformity or edema, well healed bilateral knee scars s/p  TKA. Neurologic: Upper and lower extremity motor 5/5 and symmetric  DATA: ABI  Right 1.2 with normal Triphasic flow Left 1.1.4 with normal Triphasic flow  ASSESSMENT:   Lower extremity pain and numbness s/p multiple lumbar and cervical surgeries. She has normal arterial blood flow with  palpable pulses and normal ABI's.  She has no skin changes and no history of ulcers.  I do not feel that her symptoms are from arterial blood flow problems/PAD.  She is not at risk of limb loss.   PLAN:   F/U with her Neurosurgeon Dr. Carloyn Manner as needed.   Maintaining as much activity daily and weight loss may aide in her ability to ambulate, this seems to be difficult secondary to her spinal injuries and surgeries over the years.     Miranda Keith, Miranda MAUREEN [facility]  I agree with the above.  I have seen and evaluated the patient.  She appears to be having neuropathic pain to both lower extremities.  Vascular consult patient was requested to rule out vascular etiology.  On examination the patient has palpable pedal pulses.  She also has undergone vascular ultrasound studies which reveal normal ankle-brachial indices with triphasic waveforms and normal toe pressures.  I discussed with the patient that I do not feel that she has arterial insufficiency.  Certainly this could not explain her symptoms.  She does have bilateral swelling, she does get benefit from compression stockings at night.  I have encouraged her to wear these during the day to see if this improves her symptoms.  She will contact me on an as-needed basis.  Miranda Keith

## 2015-10-16 ENCOUNTER — Other Ambulatory Visit: Payer: Self-pay | Admitting: Neurosurgery

## 2015-10-16 DIAGNOSIS — M4712 Other spondylosis with myelopathy, cervical region: Secondary | ICD-10-CM

## 2015-10-18 ENCOUNTER — Other Ambulatory Visit: Payer: Medicare Other

## 2015-10-18 ENCOUNTER — Ambulatory Visit
Admission: RE | Admit: 2015-10-18 | Discharge: 2015-10-18 | Disposition: A | Discharge status: Home / Self Care | Payer: Medicare Other | Source: Ambulatory Visit | Attending: Neurosurgery | Admitting: Neurosurgery

## 2015-10-18 DIAGNOSIS — M4712 Other spondylosis with myelopathy, cervical region: Secondary | ICD-10-CM

## 2016-06-02 ENCOUNTER — Emergency Department (HOSPITAL_COMMUNITY): Payer: Medicare Other

## 2016-06-02 ENCOUNTER — Emergency Department (HOSPITAL_COMMUNITY)
Admission: EM | Admit: 2016-06-02 | Discharge: 2016-06-02 | Disposition: A | Discharge status: Home / Self Care | Payer: Medicare Other | Attending: Emergency Medicine | Admitting: Emergency Medicine

## 2016-06-02 ENCOUNTER — Encounter (HOSPITAL_COMMUNITY): Payer: Self-pay | Admitting: Cardiology

## 2016-06-02 DIAGNOSIS — R0602 Shortness of breath: Secondary | ICD-10-CM | POA: Diagnosis not present

## 2016-06-02 DIAGNOSIS — R059 Cough, unspecified: Secondary | ICD-10-CM

## 2016-06-02 DIAGNOSIS — R05 Cough: Secondary | ICD-10-CM | POA: Insufficient documentation

## 2016-06-02 DIAGNOSIS — Z85828 Personal history of other malignant neoplasm of skin: Secondary | ICD-10-CM | POA: Diagnosis not present

## 2016-06-02 DIAGNOSIS — Z87891 Personal history of nicotine dependence: Secondary | ICD-10-CM | POA: Diagnosis not present

## 2016-06-02 DIAGNOSIS — Z79899 Other long term (current) drug therapy: Secondary | ICD-10-CM | POA: Insufficient documentation

## 2016-06-02 DIAGNOSIS — R21 Rash and other nonspecific skin eruption: Secondary | ICD-10-CM | POA: Insufficient documentation

## 2016-06-02 LAB — CBC WITH DIFFERENTIAL/PLATELET
BASOS ABS: 0 10*3/uL (ref 0.0–0.1)
BASOS PCT: 0 %
EOS ABS: 0.1 10*3/uL (ref 0.0–0.7)
EOS PCT: 3 %
HCT: 45.4 % (ref 36.0–46.0)
Hemoglobin: 15.7 g/dL — ABNORMAL HIGH (ref 12.0–15.0)
Lymphocytes Relative: 17 %
Lymphs Abs: 0.7 10*3/uL (ref 0.7–4.0)
MCH: 30.5 pg (ref 26.0–34.0)
MCHC: 34.6 g/dL (ref 30.0–36.0)
MCV: 88.3 fL (ref 78.0–100.0)
Monocytes Absolute: 0.3 10*3/uL (ref 0.1–1.0)
Monocytes Relative: 9 %
NEUTROS ABS: 2.7 10*3/uL (ref 1.7–7.7)
Neutrophils Relative %: 71 %
PLATELETS: 172 10*3/uL (ref 150–400)
RBC: 5.14 MIL/uL — ABNORMAL HIGH (ref 3.87–5.11)
RDW: 13.5 % (ref 11.5–15.5)
WBC: 3.9 10*3/uL — ABNORMAL LOW (ref 4.0–10.5)

## 2016-06-02 LAB — COMPREHENSIVE METABOLIC PANEL
ALBUMIN: 3.9 g/dL (ref 3.5–5.0)
ALK PHOS: 89 U/L (ref 38–126)
ALT: 20 U/L (ref 14–54)
ANION GAP: 9 (ref 5–15)
AST: 37 U/L (ref 15–41)
BILIRUBIN TOTAL: 1 mg/dL (ref 0.3–1.2)
BUN: 11 mg/dL (ref 6–20)
CALCIUM: 9.2 mg/dL (ref 8.9–10.3)
CO2: 25 mmol/L (ref 22–32)
CREATININE: 0.54 mg/dL (ref 0.44–1.00)
Chloride: 104 mmol/L (ref 101–111)
GFR calc Af Amer: >60 mL/min (ref >60)
GFR calc non Af Amer: >60 mL/min (ref >60)
GLUCOSE: 83 mg/dL (ref 65–99)
Potassium: 3.9 mmol/L (ref 3.5–5.1)
Sodium: 138 mmol/L (ref 135–145)
TOTAL PROTEIN: 7.1 g/dL (ref 6.5–8.1)

## 2016-06-02 LAB — CK: CK TOTAL: 90 U/L (ref 38–234)

## 2016-06-02 LAB — I-STAT TROPONIN, ED: TROPONIN I, POC: 0 ng/mL (ref 0.00–0.08)

## 2016-06-02 MED ORDER — HYDROXYZINE HCL 25 MG PO TABS
50.0000 mg | ORAL_TABLET | Freq: Once | ORAL | Status: AC
  Administered 2016-06-02: 50 mg via ORAL
  Filled 2016-06-02: qty 2

## 2016-06-02 MED ORDER — HYDROXYZINE HCL 50 MG PO TABS: 50.0000 mg | ORAL_TABLET | Freq: Three times a day (TID) | ORAL | 0 refills | Status: DC | PRN

## 2016-06-02 MED ORDER — AZITHROMYCIN 250 MG PO TABS: 250.0000 mg | ORAL_TABLET | Freq: Every day | ORAL | 0 refills | Status: DC

## 2016-06-02 MED ORDER — ALBUTEROL SULFATE HFA 108 (90 BASE) MCG/ACT IN AERS: 1.0000 | INHALATION_SPRAY | Freq: Four times a day (QID) | RESPIRATORY_TRACT | 0 refills | Status: AC | PRN

## 2016-06-02 NOTE — ED Provider Notes (Signed)
Emergency Department Provider Note   I have reviewed the triage vital signs and the nursing notes.   HISTORY  Chief Complaint Rash and Shortness of Breath   HPI Miranda Keith is a 71 y.o. female with PMH of DDD, fibromyalgia, neuropathy, and OSA presents to the emergency department for evaluation of persistent itchy rash over her entire body with new onset/worsening shortness of breath. She states the rash has been present since January of this year. She has seen both a rheumatologist and dermatologist regarding this issue. She had 2 skin biopsies taken and the results of one are pending. She called to try and discuss these results over the phone with her dermatologist today but was unable to reach them and so presented to the emergency department. Patient, on arrival, states that she is also having some increased difficulty breathing. She denies any chest pain but states she has increased difficulty breathing with deep inhalation. No radiation of symptoms. No history of oxygen requirement. She has a remote smoking history but no COPD or congestive heart failure. No fever or chills.   Past Medical History:  Diagnosis Date  . Allergic rhinitis   . Arthritis   . Back pain   . Callus of foot    right foot - treatment on 07/31/2014 started on antibiotics   . Degenerative disc disease   . Fibromyalgia    Reported by pt on 08/27/09  . History of hiatal hernia   . Joint pain   . Neck pain   . Neuropathy ([place])    hands  . Obesity   . Peripheral neuropathy ([place])   . Skin cancer   . Sleep apnea    "mild sleep apnea" unable to tolerate c-pap    Patient Active Problem List   Diagnosis Date Noted  . Lumbar radicular pain 01/28/2015  . Abnormality of gait 01/28/2015  . Gastric band malfunction 05/02/2013  . OA (osteoarthritis) of knee 07/13/2012  . OSA (obstructive sleep apnea) 10/21/2010  . COLONIC POLYPS, HYPERPLASTIC 03/12/2007  . HYPERCHOLESTEROLEMIA 03/12/2007  . OBESITY  03/12/2007  . CONSTIPATION 03/12/2007  . IRRITABLE BOWEL SYNDROME 03/12/2007  . MELANOSIS COLI 03/12/2007  . ARTHRITIS 03/12/2007  . BACK PAIN, CHRONIC 03/12/2007  . ABDOMINAL PAIN, LOWER 03/12/2007  . ESOPHAGITIS, REFLUX 01/17/2007  . HIATAL HERNIA 01/17/2007  . DIVERTICULOSIS, COLON 01/17/2007    Past Surgical History:  Procedure Laterality Date  . APPENDECTOMY    . BACK SURGERY     lower back x 2  . LAPAROSCOPIC GASTRIC BANDING  12/03/09   removed  . neck fusion  2001, 2013  . ROTATOR CUFF REPAIR     left  . TOTAL ABDOMINAL HYSTERECTOMY  1994  . TOTAL KNEE ARTHROPLASTY     right  . TOTAL KNEE ARTHROPLASTY Left 07/13/2012   Procedure: LEFT TOTAL KNEE ARTHROPLASTY;  Surgeon: Gearlean Alf, MD;  Location: [facility];  Service: Orthopedics;  Laterality: Left;    Current Outpatient Rx  . Order #: [id number] Class: Print  . Order #: [id number] Class: Print  . Order #: [id number] Class: Historical Med  . Order #: [id number] Class: Historical Med  . Order #: [id number] Class: Historical Med  . Order #: [id number] Class: Historical Med  . Order #: [id number] Class: Print  . Order #: [id number] Class: Historical Med  . Order #: [id number] Class: Historical Med  . Order #: [id number] Class: Historical Med  . Order #: [id number] Class: Historical Med  . Order #: [id number] Class: Historical Med  . Order #: [id number] Class: Historical Med  .  Order #: [id number] Class: Print    Allergies Patient has no known allergies.  Family History  Problem Relation Age of Onset  . Allergies Mother   . Rheum arthritis Mother   . Heart disease Mother   . Colon cancer Father   . Throat cancer Father   . Colon cancer Paternal Aunt   . Colon cancer Paternal Aunt   . Allergies Other     grandchildren  . Colon cancer Paternal Grandmother   . Stomach cancer Neg Hx     Social History Social History  Substance Use Topics  . Smoking status: Former Smoker    Types: Cigarettes    Quit date: 02/23/1978  . Smokeless tobacco:  Never Used     Comment: quit 1980s.  only smoked' rarely" - couldn't remember how many cigs x how many yrs.   . Alcohol use 0.0 oz/week     Comment: very seldom    Review of Systems  Constitutional: No fever/chills Eyes: No visual changes. ENT: No sore throat. Cardiovascular: Denies chest pain. Respiratory: Positive shortness of breath. Gastrointestinal: No abdominal pain.  No nausea, no vomiting.  No diarrhea.  No constipation. Genitourinary: Negative for dysuria. Musculoskeletal: Negative for back pain. Skin: Positive for rash. Neurological: Negative for headaches, focal weakness or numbness.  10-point ROS otherwise negative.  ____________________________________________   PHYSICAL EXAM:  VITAL SIGNS: ED Triage Vitals  Enc Vitals Group     BP 06/02/16 1235 122/64     Pulse Rate 06/02/16 1235 (!) 109     Resp 06/02/16 1235 20     Temp 06/02/16 1235 97.6 F (36.4 C)     Temp Source 06/02/16 1235 Oral     SpO2 06/02/16 1235 95 %     Weight 06/02/16 1236 235 lb (106.6 kg)     Height 06/02/16 1236 5' 3.5" (1.613 m)   Constitutional: Alert and oriented. Well appearing and in no acute distress. Eyes: Conjunctivae are normal.  Head: Atraumatic. Nose: No congestion/rhinnorhea. Mouth/Throat: Mucous membranes are moist.   Neck: No stridor.   Cardiovascular: Normal rate, regular rhythm. Good peripheral circulation. Grossly normal heart sounds.   Respiratory: Normal respiratory effort.  No retractions. Lungs CTAB. Gastrointestinal: Soft and nontender. No distention.  Musculoskeletal: No lower extremity tenderness nor edema. No gross deformities of extremities. Neurologic:  Normal speech and language. No gross focal neurologic deficits are appreciated.  Skin:  Skin is warm, dry and intact. Diffuse erythematous   ____________________________________________   LABS (all labs ordered are listed, but only abnormal results are displayed)  Labs Reviewed  CBC WITH  DIFFERENTIAL/PLATELET - Abnormal; Notable for the following:       Result Value   WBC 3.9 (*)    RBC 5.14 (*)    Hemoglobin 15.7 (*)    All other components within normal limits  COMPREHENSIVE METABOLIC PANEL  CK  I-STAT TROPOININ, ED   ____________________________________________  EKG   EKG Interpretation  Date/Time:  Tuesday June 02 2016 12:38:38 EDT Ventricular Rate:  104 PR Interval:  164 QRS Duration: 80 QT Interval:  340 QTC Calculation: 447 R Axis:   -85 Text Interpretation:  Sinus tachycardia Low voltage QRS Left anterior fascicular block Cannot rule out Inferior infarct (masked by fascicular block?) , age undetermined Cannot rule out Anterior infarct , age undetermined Abnormal ECG No STEMI. Simila to prior.  Confirmed by Owen Pratte MD, Mashawn Brazil [phone]) on 06/02/2016 5:28:40 PM       ____________________________________________  RADIOLOGY  Dg Chest 2 View  Result Date: 06/02/2016 CLINICAL DATA:  Mild bronchitic changes with bibasilar atelectasis/scarring. EXAM: CHEST  2 VIEW COMPARISON:  Chest radiograph report dated November 11, 2012 though images are not available for direct comparison. FINDINGS: Cardiomediastinal silhouette is normal, mildly calcified aortic knob. Mild bronchitic changes. Bibasilar strandy densities without pleural effusion or focal consolidation. Moderate degenerative changes spine. Cervical spine hardware. Acromioclavicular RIGHT undersurface spurring associated with impingement. IMPRESSION: Mild bronchitic changes with bibasilar atelectasis/scarring. Electronically Signed   By: Elon Alas M.D.   On: 06/02/2016 17:57    ____________________________________________   PROCEDURES  Procedure(s) performed:   Procedures  None ____________________________________________   INITIAL IMPRESSION / ASSESSMENT AND PLAN / ED COURSE  Pertinent labs & imaging results that were available during my care of the patient were reviewed by me and considered  in my medical decision making (see chart for details).  She presents to the emergency department for evaluation of persistent rash over the past 4 months that is itchy. She's had some worsening shortness of breath that started today. No chest pain. Worse with deep inspiration. Patient is following with an outpatient dermatologist and rheumatologist for her skin condition. She has a dry, erythematous rash over her trunk and upper extremities. Erythema focused over the joints of the hand. No evidence of septic arthritis. Rash is not petechial. No fevers or chills. Patient's dyspnea somewhat concerning. She's had no chest pain. No hypoxemia. Lung exam is otherwise unremarkable. For chest x-ray, labs including CK with report of recent myositis diagnosis.   06:45 PM Lab work and chest x-ray are largely unremarkable. Patient may be developing early bronchitis symptoms which does correlate clinically. Advised that she take her steroid as described by her rheumatologist and she will follow with dermatology tomorrow to discuss the biopsy results. Plan for outpatient treatment of bronchitis symptoms. Discussed return precautions in detail.   At this time, I do not feel there is any life-threatening condition present. I have reviewed and discussed all results (EKG, imaging, lab, urine as appropriate), exam findings with patient. I have reviewed nursing notes and appropriate previous records.  I feel the patient is safe to be discharged home without further emergent workup. Discussed usual and customary return precautions. Patient and family (if present) verbalize understanding and are comfortable with this plan.  Patient will follow-up with their primary care provider. If they do not have a primary care provider, information for follow-up has been provided to them. All questions have been answered.  ____________________________________________  FINAL CLINICAL IMPRESSION(S) / ED DIAGNOSES  Final diagnoses:  Rash    Shortness of breath  Cough     MEDICATIONS GIVEN DURING THIS VISIT:  Medications  hydrOXYzine (ATARAX/VISTARIL) tablet 50 mg (50 mg Oral Given 06/02/16 1751)     NEW OUTPATIENT MEDICATIONS STARTED DURING THIS VISIT:  New Prescriptions   ALBUTEROL (PROVENTIL HFA;VENTOLIN HFA) 108 (90 BASE) MCG/ACT INHALER    Inhale 1-2 puffs into the lungs every 6 (six) hours as needed for wheezing or shortness of breath.   AZITHROMYCIN (ZITHROMAX) 250 MG TABLET    Take 1 tablet (250 mg total) by mouth daily. Take first 2 tablets together, then 1 every day until finished.   HYDROXYZINE (ATARAX/VISTARIL) 50 MG TABLET    Take 1 tablet (50 mg total) by mouth every 8 (eight) hours as needed.      Note:  This document was prepared using Dragon voice recognition software and may include unintentional dictation errors.  Nanda Quinton, MD Emergency Medicine   [facility]  Emiline Mancebo, MD 06/02/16 4996

## 2016-06-02 NOTE — ED Triage Notes (Signed)
Rash on arms , back and sacral area since January.  Has seen Dr. Nevada Crane and had a biopsy done recently.  SOB for a while,  Worse in the past 2 weeks.

## 2016-06-02 NOTE — Discharge Instructions (Signed)
You were seen in the ED today with difficulty breathing and rash. In terms of the rash I recommend close follow up with the dermatologist to discuss the biopsy results. I have increased the dose of the itching medication. I recommend starting the steroid prescribed by the Rheumatologist.   In terms of your difficulty breathing you may be developing bronchitis. You should start the steroid as discussed above and I will prescribe an inhaler and antibiotic to use for the next several days.   Return to the ED immediately if you develop any chest pain, difficulty breathing, fever, chills, or other worsening symptoms.

## 2016-08-11 ENCOUNTER — Encounter (HOSPITAL_BASED_OUTPATIENT_CLINIC_OR_DEPARTMENT_OTHER): Payer: Medicare Other | Attending: Surgery

## 2016-08-11 ENCOUNTER — Ambulatory Visit (HOSPITAL_COMMUNITY)
Admission: RE | Admit: 2016-08-11 | Discharge: 2016-08-11 | Disposition: A | Discharge status: Home / Self Care | Payer: Medicare Other | Source: Ambulatory Visit | Attending: Surgery | Admitting: Surgery

## 2016-08-11 ENCOUNTER — Other Ambulatory Visit: Payer: Self-pay | Admitting: Surgery

## 2016-08-11 DIAGNOSIS — M797 Fibromyalgia: Secondary | ICD-10-CM | POA: Insufficient documentation

## 2016-08-11 DIAGNOSIS — Z79899 Other long term (current) drug therapy: Secondary | ICD-10-CM | POA: Diagnosis not present

## 2016-08-11 DIAGNOSIS — Z96659 Presence of unspecified artificial knee joint: Secondary | ICD-10-CM | POA: Insufficient documentation

## 2016-08-11 DIAGNOSIS — M85872 Other specified disorders of bone density and structure, left ankle and foot: Secondary | ICD-10-CM | POA: Diagnosis not present

## 2016-08-11 DIAGNOSIS — L97513 Non-pressure chronic ulcer of other part of right foot with necrosis of muscle: Secondary | ICD-10-CM | POA: Diagnosis not present

## 2016-08-11 DIAGNOSIS — G4730 Sleep apnea, unspecified: Secondary | ICD-10-CM | POA: Diagnosis not present

## 2016-08-11 DIAGNOSIS — M869 Osteomyelitis, unspecified: Secondary | ICD-10-CM

## 2016-08-11 DIAGNOSIS — L97521 Non-pressure chronic ulcer of other part of left foot limited to breakdown of skin: Secondary | ICD-10-CM | POA: Diagnosis not present

## 2016-08-11 DIAGNOSIS — Z9884 Bariatric surgery status: Secondary | ICD-10-CM | POA: Insufficient documentation

## 2016-08-11 DIAGNOSIS — Z7952 Long term (current) use of systemic steroids: Secondary | ICD-10-CM | POA: Diagnosis not present

## 2016-08-11 DIAGNOSIS — G6182 Multifocal motor neuropathy: Secondary | ICD-10-CM | POA: Diagnosis not present

## 2016-08-18 DIAGNOSIS — L97513 Non-pressure chronic ulcer of other part of right foot with necrosis of muscle: Secondary | ICD-10-CM | POA: Diagnosis not present

## 2016-08-19 ENCOUNTER — Other Ambulatory Visit: Payer: Self-pay | Admitting: Surgery

## 2016-08-19 DIAGNOSIS — L97513 Non-pressure chronic ulcer of other part of right foot with necrosis of muscle: Secondary | ICD-10-CM

## 2016-08-24 ENCOUNTER — Ambulatory Visit (HOSPITAL_COMMUNITY)
Admission: RE | Admit: 2016-08-24 | Discharge: 2016-08-24 | Disposition: A | Discharge status: Home / Self Care | Payer: Medicare Other | Source: Ambulatory Visit | Attending: Surgery | Admitting: Surgery

## 2016-08-24 DIAGNOSIS — L089 Local infection of the skin and subcutaneous tissue, unspecified: Secondary | ICD-10-CM | POA: Insufficient documentation

## 2016-08-24 DIAGNOSIS — L97513 Non-pressure chronic ulcer of other part of right foot with necrosis of muscle: Secondary | ICD-10-CM | POA: Diagnosis present

## 2016-08-24 DIAGNOSIS — L97518 Non-pressure chronic ulcer of other part of right foot with other specified severity: Secondary | ICD-10-CM | POA: Diagnosis not present

## 2016-08-24 LAB — POCT I-STAT CREATININE: Creatinine, Ser: 0.6 mg/dL (ref 0.44–1.00)

## 2016-08-24 MED ORDER — GADOBENATE DIMEGLUMINE 529 MG/ML IV SOLN
20.0000 mL | Freq: Once | INTRAVENOUS | Status: AC | PRN
  Administered 2016-08-24: 20 mL via INTRAVENOUS

## 2016-08-25 ENCOUNTER — Encounter (HOSPITAL_BASED_OUTPATIENT_CLINIC_OR_DEPARTMENT_OTHER): Payer: Medicare Other

## 2016-08-28 ENCOUNTER — Encounter (HOSPITAL_BASED_OUTPATIENT_CLINIC_OR_DEPARTMENT_OTHER): Payer: Medicare Other | Attending: Surgery

## 2016-08-28 DIAGNOSIS — Z9884 Bariatric surgery status: Secondary | ICD-10-CM | POA: Insufficient documentation

## 2016-08-28 DIAGNOSIS — G4730 Sleep apnea, unspecified: Secondary | ICD-10-CM | POA: Insufficient documentation

## 2016-08-28 DIAGNOSIS — Z96659 Presence of unspecified artificial knee joint: Secondary | ICD-10-CM | POA: Diagnosis not present

## 2016-08-28 DIAGNOSIS — G6182 Multifocal motor neuropathy: Secondary | ICD-10-CM | POA: Insufficient documentation

## 2016-08-28 DIAGNOSIS — L89892 Pressure ulcer of other site, stage 2: Secondary | ICD-10-CM | POA: Insufficient documentation

## 2016-08-28 DIAGNOSIS — L89612 Pressure ulcer of right heel, stage 2: Secondary | ICD-10-CM | POA: Insufficient documentation

## 2016-08-31 ENCOUNTER — Encounter: Payer: Self-pay | Admitting: Cardiovascular Disease

## 2016-08-31 ENCOUNTER — Encounter: Payer: Self-pay | Admitting: Cardiology

## 2016-08-31 ENCOUNTER — Other Ambulatory Visit: Payer: Self-pay | Admitting: Internal Medicine

## 2016-09-08 DIAGNOSIS — L89892 Pressure ulcer of other site, stage 2: Secondary | ICD-10-CM | POA: Diagnosis not present

## 2016-09-15 DIAGNOSIS — L89892 Pressure ulcer of other site, stage 2: Secondary | ICD-10-CM | POA: Diagnosis not present

## 2016-09-21 ENCOUNTER — Encounter: Payer: Self-pay | Admitting: Cardiovascular Disease

## 2016-09-23 ENCOUNTER — Encounter (HOSPITAL_BASED_OUTPATIENT_CLINIC_OR_DEPARTMENT_OTHER): Payer: Medicare Other | Attending: Surgery

## 2016-09-23 DIAGNOSIS — Z79899 Other long term (current) drug therapy: Secondary | ICD-10-CM | POA: Diagnosis not present

## 2016-09-23 DIAGNOSIS — Z9884 Bariatric surgery status: Secondary | ICD-10-CM | POA: Insufficient documentation

## 2016-09-23 DIAGNOSIS — M797 Fibromyalgia: Secondary | ICD-10-CM | POA: Diagnosis not present

## 2016-09-23 DIAGNOSIS — L97521 Non-pressure chronic ulcer of other part of left foot limited to breakdown of skin: Secondary | ICD-10-CM | POA: Insufficient documentation

## 2016-09-23 DIAGNOSIS — Z96659 Presence of unspecified artificial knee joint: Secondary | ICD-10-CM | POA: Insufficient documentation

## 2016-09-23 DIAGNOSIS — L97419 Non-pressure chronic ulcer of right heel and midfoot with unspecified severity: Secondary | ICD-10-CM | POA: Insufficient documentation

## 2016-09-23 DIAGNOSIS — Z7952 Long term (current) use of systemic steroids: Secondary | ICD-10-CM | POA: Insufficient documentation

## 2016-09-23 DIAGNOSIS — L97513 Non-pressure chronic ulcer of other part of right foot with necrosis of muscle: Secondary | ICD-10-CM | POA: Insufficient documentation

## 2016-09-23 DIAGNOSIS — G6182 Multifocal motor neuropathy: Secondary | ICD-10-CM | POA: Insufficient documentation

## 2016-09-28 ENCOUNTER — Encounter: Payer: Self-pay | Admitting: Cardiovascular Disease

## 2016-09-28 ENCOUNTER — Ambulatory Visit (INDEPENDENT_AMBULATORY_CARE_PROVIDER_SITE_OTHER): Payer: Medicare Other | Admitting: Cardiovascular Disease

## 2016-09-28 VITALS — BP 132/82 | HR 111 | Ht 63.0 in | Wt 228.0 lb

## 2016-09-28 DIAGNOSIS — I712 Thoracic aortic aneurysm, without rupture: Secondary | ICD-10-CM

## 2016-09-28 DIAGNOSIS — I7121 Aneurysm of the ascending aorta, without rupture: Secondary | ICD-10-CM

## 2016-09-28 DIAGNOSIS — Z01818 Encounter for other preprocedural examination: Secondary | ICD-10-CM | POA: Diagnosis not present

## 2016-09-28 NOTE — Progress Notes (Signed)
SUBJECTIVE: The patient presents for preoperative risk stratification. She is being scheduled undergo right Achilles tendon lengthening and right forefoot wound debridement.  I saw her once before in June 2016.  ABIs in June 2017 were unremarkable.  She has a history of multiple lumbar and cervical spine surgeries.  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 She then developed right foot complications from high dose steroids.  She has been experiencing shortness of breath and a cough and was recently started on prednisone again but respiratory symptoms are improving.  Chest CT 08/31/16 demonstrated a 4.2 cm ascending thoracic aortic aneurysm.   Echocardiogram 09/21/16: Vigorous left ventricular systolic function, LVEF 97-35%, trivial aortic regurgitation, structurally normal aortic valve, ascending aortic aneurysm measuring 4.51 cm.  ECG performed on 08/31/16 demonstrated sinus rhythm with possible old inferior infarct and late R-wave transition.  She has felt fatigued over the course of this past year. She is being scheduled to see rheumatology at [facility].   Review of Systems: As per "subjective", otherwise negative.  No Known Allergies  Current Outpatient Prescriptions  Medication Sig Dispense Refill  . albuterol (PROVENTIL HFA;VENTOLIN HFA) 108 (90 Base) MCG/ACT inhaler Inhale 1-2 puffs into the lungs every 6 (six) hours as needed for wheezing or shortness of breath. 1 Inhaler 0  . benzonatate (TESSALON) 100 MG capsule Take by mouth 3 (three) times daily as needed for cough.    . Cholecalciferol (VITAMIN D3) 5000 UNITS CAPS Take 1 capsule by mouth daily.    . clonazePAM (KLONOPIN) 0.5 MG tablet Reported on 08/19/2015    . fluticasone furoate-vilanterol (BREO ELLIPTA) 200-25 MCG/INH AEPB Inhale 1 puff into the lungs daily.    . furosemide (LASIX) 20 MG tablet Take 20 mg by mouth daily as needed (for fluid).      . hydrOXYzine (ATARAX/VISTARIL) 25 MG tablet Take 25 mg by mouth 3 (three) times daily as needed.    . Magnesium 400 MG CAPS Take 400 mg by mouth daily.     . methocarbamol (ROBAXIN) 500 MG tablet Take 500 mg by mouth every 8 (eight) hours as needed for muscle spasms.     . Multiple Vitamins-Minerals (MULTIVITAMIN WITH MINERALS) tablet Take 1 tablet by mouth daily.    . potassium chloride (K-DUR) 10 MEQ tablet Take 10 mEq by mouth daily as needed. Only takes when taking lasix    . predniSONE (DELTASONE) 5 MG tablet Take 5 mg by mouth daily with breakfast.    . pregabalin (LYRICA) 25 MG capsule Take 25 mg by mouth 3 (three) times daily. Reported on 08/19/2015    . traMADol (ULTRAM) 50 MG tablet Take 1-2 tablets (50-100 mg total) by mouth every 6 (six) hours as needed (mild pain). 60 tablet 0   No current facility-administered medications for this visit.     Past Medical History:  Diagnosis Date  . Allergic rhinitis   . Arthritis   . Back pain   . Callus of foot    right foot - treatment on 07/31/2014 started on antibiotics   . Degenerative disc disease   . Fibromyalgia    Reported by pt on 08/27/09  . History of hiatal hernia   . Joint pain   . Neck pain   . Neuropathy    hands  . Obesity   . Peripheral neuropathy   . Skin cancer   . Sleep apnea    "mild sleep apnea" unable  to tolerate c-pap    Past Surgical History:  Procedure Laterality Date  . APPENDECTOMY    . BACK SURGERY     lower back x 2  . LAPAROSCOPIC GASTRIC BANDING  12/03/09   removed  . neck fusion  2001, 2013  . ROTATOR CUFF REPAIR     left  . TOTAL ABDOMINAL HYSTERECTOMY  1994  . TOTAL KNEE ARTHROPLASTY     right  . TOTAL KNEE ARTHROPLASTY Left 07/13/2012   Procedure: LEFT TOTAL KNEE ARTHROPLASTY;  Surgeon: Gearlean Alf, MD;  Location: [facility];  Service: Orthopedics;  Laterality: Left;    Social History   Social History  . Marital status: Widowed    Spouse name: N/A  . Number of children: 1  .  Years of education: HS   Occupational History  . retired from [employer] Retired   Social History Main Topics  . Smoking status: Former Smoker    Types: Cigarettes    Quit date: 02/23/1978  . Smokeless tobacco: Never Used     Comment: quit 1980s.  only smoked' rarely" - couldn't remember how many cigs x how many yrs.   . Alcohol use 0.0 oz/week     Comment: very seldom  . Drug use: No  . Sexual activity: Not on file   Other Topics Concern  . Not on file   Social History Narrative   Lives with her mom.    Right-handed.   No caffeine use.     Vitals:   09/28/16 1539  BP: 132/82  Pulse: (!) 111  SpO2: 92%  Weight: 228 lb (103.4 kg)  Height: 5\' 3"  (1.6 m)    Wt Readings from Last 3 Encounters:  09/28/16 228 lb (103.4 kg)  06/02/16 235 lb (106.6 kg)  08/19/15 265 lb (120.2 kg)     PHYSICAL EXAM General: NAD HEENT: Normal. Neck: No JVD, no thyromegaly. Lungs: Clear to auscultation bilaterally with normal respiratory effort. CV: Nondisplaced PMI.  Regular rate and rhythm, normal S1/S2, no S3/S4, no murmur. No pretibial or periankle edema.  No carotid bruit.   Abdomen: Soft, nontender, no distention.  Neurologic: Alert and oriented.  Psych: Normal affect. Skin: Erythema and skin thickening over extensor surfaces of elbows. Musculoskeletal: Right foot in boot.    ECG: Most recent ECG reviewed.   Labs: Lab Results  Component Value Date/Time   K 3.9 06/02/2016 05:09 PM   BUN 11 06/02/2016 05:09 PM   CREATININE 0.60 08/24/2016 07:19 PM   ALT 20 06/02/2016 05:09 PM   TSH 3.59 01/11/2007 03:43 PM   HGB 15.7 (H) 06/02/2016 05:09 PM     Lipids: No results found for: LDLCALC, LDLDIRECT, CHOL, TRIG, HDL     ASSESSMENT AND PLAN: 1. Preoperative risk stratification: I feel she is at an acceptable level of risk to proceed with planned surgery. No additional noninvasive cardiac testing is indicated at this time.  2. Ascending aortic aneurysm: I will obtain a  CTA in 6 months to evaluate for aneurysmal enlargement. If it remains stable I will proceed with annual imaging.    Disposition: Follow up 1 yr.  Time spent: 40 minutes, of which greater than 50% was spent reviewing symptoms, relevant blood tests and studies, and discussing management plan with the patient.    Kate Sable, M.D., F.A.C.C.

## 2016-09-28 NOTE — Patient Instructions (Signed)
Medication Instructions:  Continue all current medications.  Labwork: none  Testing/Procedures: CTA of chest - due in 6 months   Follow-Up: Your physician wants you to follow up in:  1 year.  You will receive a reminder letter in the mail one-two months in advance.  If you don't receive a letter, please call our office to schedule the follow up appointment   Any Other Special Instructions Will Be Listed Below (If Applicable).  If you need a refill on your cardiac medications before your next appointment, please call your pharmacy.

## 2016-10-06 ENCOUNTER — Inpatient Hospital Stay
Admission: AD | Admit: 2016-10-06 | Payer: Self-pay | Source: Other Acute Inpatient Hospital | Admitting: Family Medicine

## 2016-10-24 DEATH — deceased

## 2016-11-17 ENCOUNTER — Institutional Professional Consult (permissible substitution): Payer: Medicare Other | Admitting: Internal Medicine

## 2018-03-24 ENCOUNTER — Telehealth: Payer: Self-pay | Admitting: Cardiovascular Disease

## 2018-03-24 NOTE — Telephone Encounter (Signed)
Numerous attempts to contact patient with recall letters. Unable to reach by telephone. with no success.   Weston Anna [[id number]] 09/28/2016 4:15 PM New [10]    [System] 06/23/2017 11:02 PM Notification Sent [20]   Delfino Lovett T [[id number]] 11/18/2017 11:10 AM Notification Sent [20]   Chanda Busing [[id number]] 11/18/2017 11:39 AM Notification Sent [20]   Chanda Busing [[id number]] 01/04/2018 2:15 PM Notification Sent [20]   Chanda Busing [[id number]] 03/24/2018 11:03 AM Notification Sent [20]

## 2018-08-12 ENCOUNTER — Encounter: Payer: Self-pay | Admitting: Gastroenterology

## 2018-08-22 ENCOUNTER — Encounter: Payer: Self-pay | Admitting: Gastroenterology

## 2018-09-12 IMAGING — MR MR FOOT*R* WO/W CM
4 of 9 series · 16 of 40 positions shown · IV contrast (multihance)
Comparison: 08/11/2016 radiographs

CLINICAL DATA: Forefoot wound/ ulcerations along the plantar side
of the first and second metatarsal heads for 2 months.

EXAM:
MRI OF THE RIGHT FOREFOOT WITHOUT AND WITH CONTRAST
TECHNIQUE: Multiplanar, multisequence MR imaging of the right forefoot was
performed before and after the administration of intravenous
contrast.
CONTRAST:  20mL MULTIHANCE GADOBENATE DIMEGLUMINE 529 MG/ML IV SOLN

[Series 8: T1 · oblique · 3.0mm · 0.21mm/px · 5 of 46 slices shown (1 of 2)]
[im 1/46]
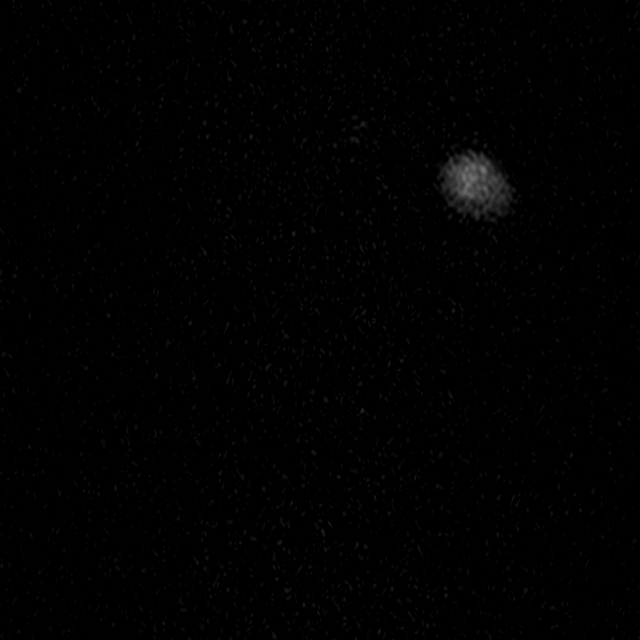
[im 12/46]
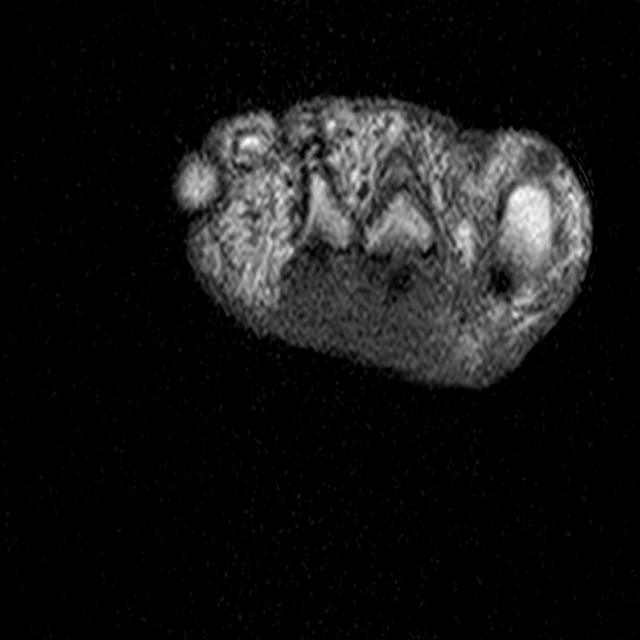
[im 23/46]
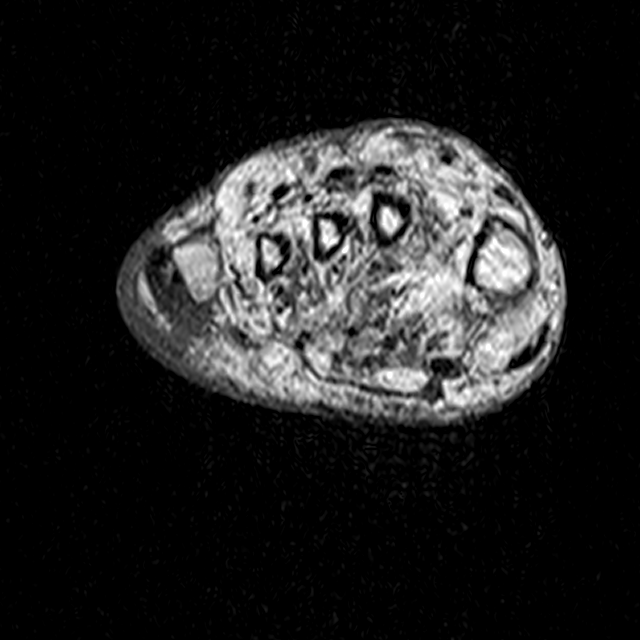
[im 34/46]
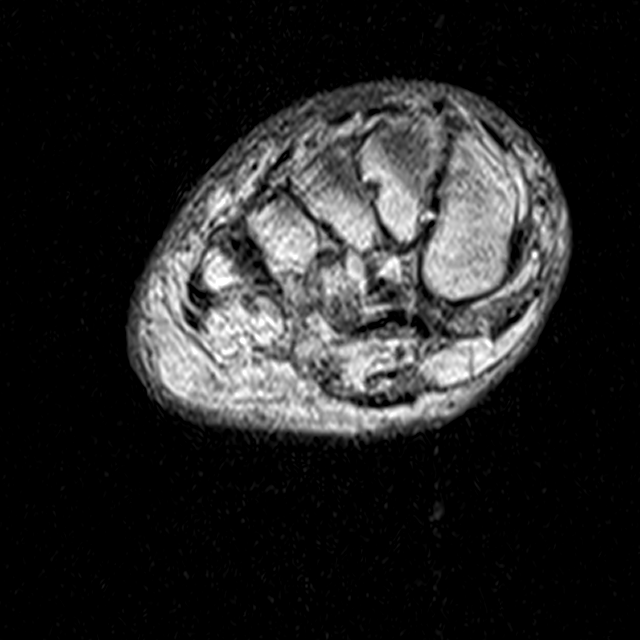
[im 46/46]
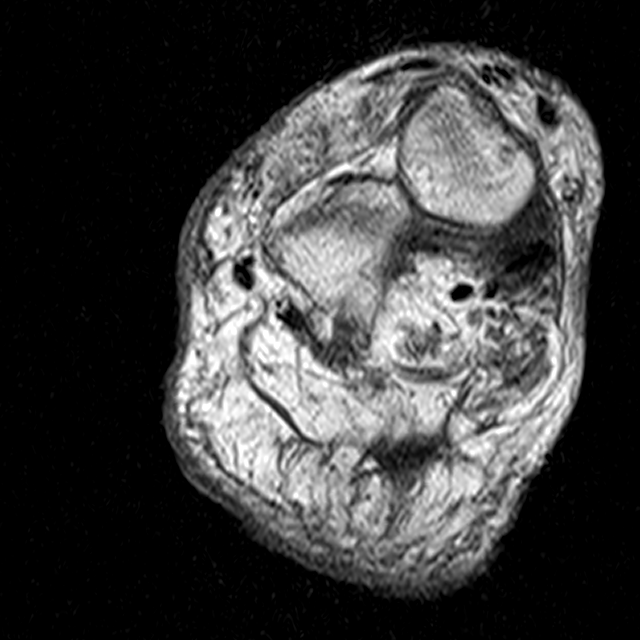

[Series 9: t2fs axial · oblique · 3.0mm · 0.26mm/px · 5 of 46 slices shown]
[im 1/46]
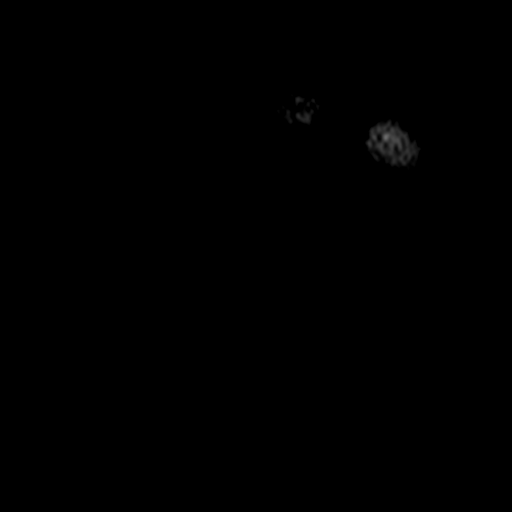
[im 10/46]
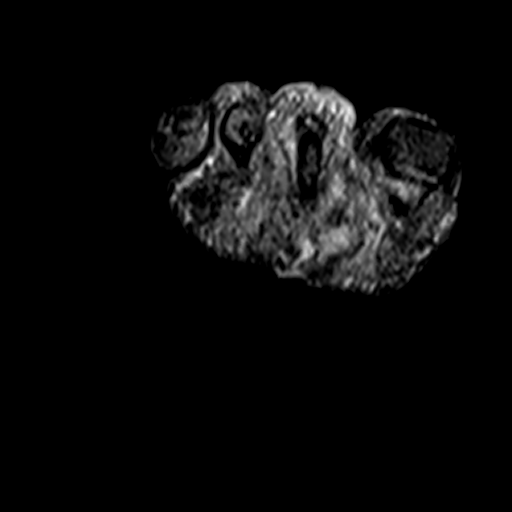
[im 19/46]
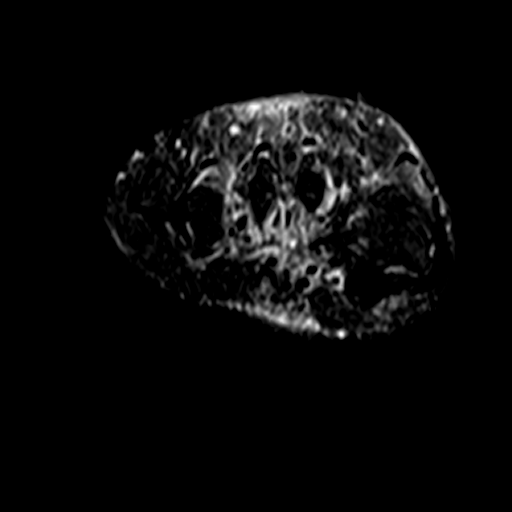
[im 28/46]
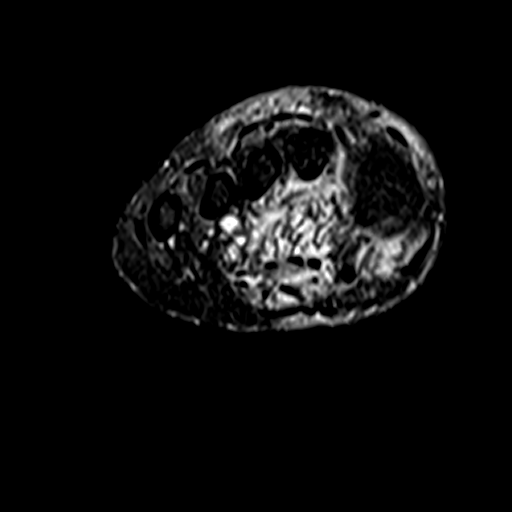
[im 46/46]
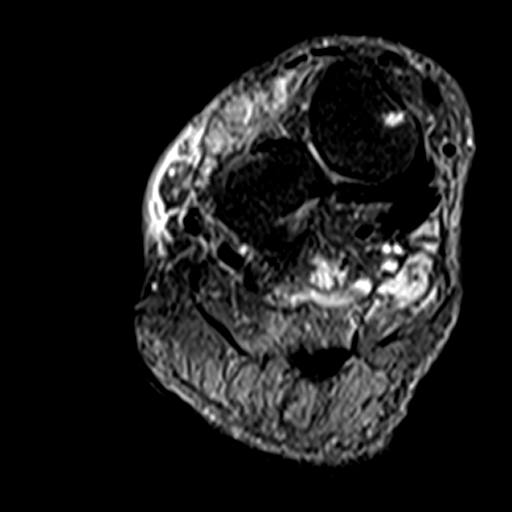

[Series 10: ir sagital · sagittal · 3.0mm · 0.34mm/px · 3 of 29 slices shown]
[im 1/29]
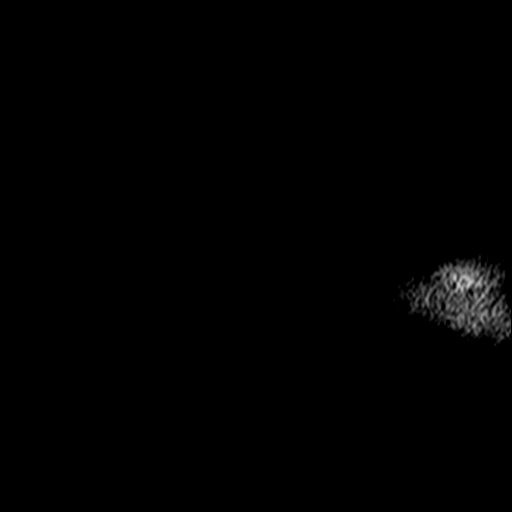
[im 19/29]
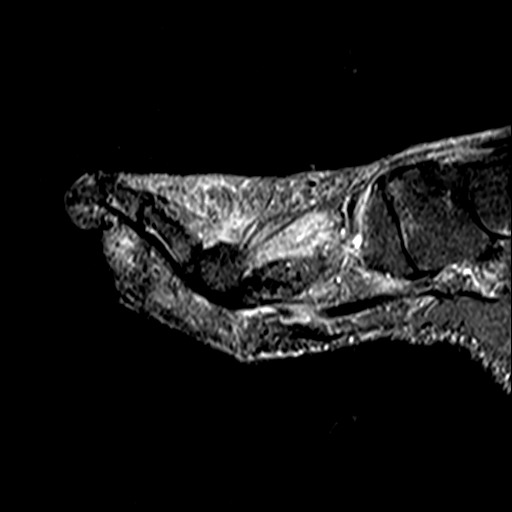
[im 29/29]
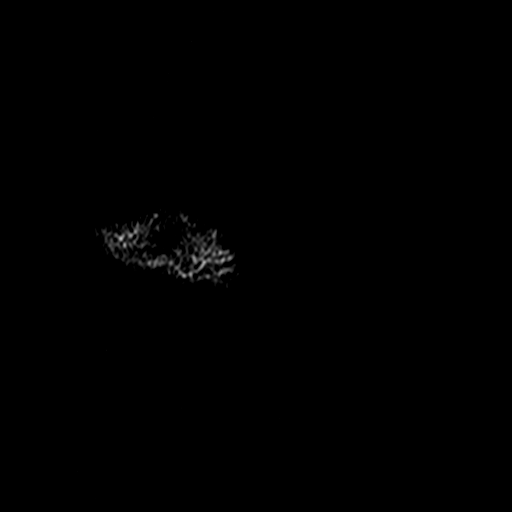

[Series 13: T1 · oblique · 3.0mm · 0.34mm/px · 3 of 28 slices shown (2 of 2)]
[im 1/28]
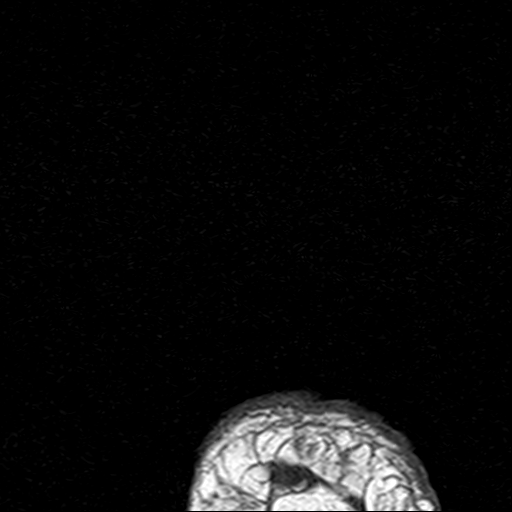
[im 14/28]
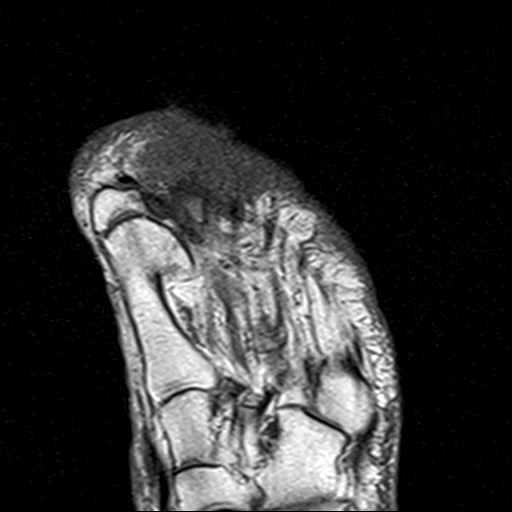
[im 28/28]
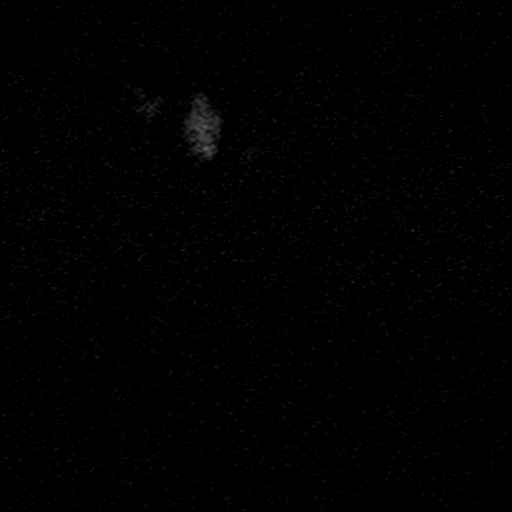

[16 of 40 positions shown; findings below may reference images not displayed]

FINDINGS: Despite efforts by the technologist and patient, motion artifact is
present on today's exam and could not be eliminated. This reduces
exam sensitivity and specificity.

Bones/Joint/Cartilage

Considerable spurring of the first metacarpal head with bifida
medial sesamoid. No findings of active osteomyelitis.

Ligaments

Lisfranc ligament intact

Muscles and Tendons

Edema signal in the extensor digitorum brevis muscle and in the
remaining plantar musculature of the foot, notably the flexor
digitorum brevis, much of which may be neurogenic. A component of
myositis is difficult to exclude.

Soft tissues

Plantar to the second and third MCP joints, and to a lesser extent
the first MCP joint, there is abnormal regional soft tissue
enhancement with some central hypoenhancement. The hypoenhancement
plantar to the second MCP joint measures 2.9 by 1.4 by 1.0 cm on
image [DATE] and appears to drain to the skin surface as shown on
image [DATE]. This questionably connects to the hypoenhancing 1.9 by
0.7 by 1.0 cm collection plantar to the third MTP joint. Appearance
suggests ulceration and deep soft tissue infection draining to the
skin surface. Dorsal subcutaneous edema in the foot.
IMPRESSION: 1. Ulceration with deep soft tissue infection plantar to the second
and third MTP joints, appearing to drain to the skin surface at the
second MTP joint level. Lesser involvement at the first MTP joint.
2. No active osteomyelitis identified.
3. Considerable spurring of the first metacarpal head.
4. Edema signal in the extensor digitorum brevis and flexor
digitorum brevis muscles, probably neurogenic, less likely due to
myositis.
